# Patient Record
Sex: Female | Born: 1955 | Race: Black or African American | Hispanic: No | Smoking: Never smoker
Health system: Southern US, Community
[De-identification: ages and names within clinical notes are randomized; demographics above are authoritative.]

## PROBLEM LIST (undated history)

## (undated) DIAGNOSIS — I10 Essential (primary) hypertension: Secondary | ICD-10-CM

## (undated) DIAGNOSIS — K219 Gastro-esophageal reflux disease without esophagitis: Secondary | ICD-10-CM

## (undated) DIAGNOSIS — E119 Type 2 diabetes mellitus without complications: Secondary | ICD-10-CM

## (undated) HISTORY — DX: Type 2 diabetes mellitus without complications: E11.9

## (undated) HISTORY — DX: Gastro-esophageal reflux disease without esophagitis: K21.9

## (undated) HISTORY — DX: Essential (primary) hypertension: I10

## (undated) HISTORY — PX: OTHER SURGICAL HISTORY: SHX169

---

## 2000-12-18 ENCOUNTER — Emergency Department (HOSPITAL_COMMUNITY): Admission: EM | Admit: 2000-12-18 | Discharge: 2000-12-19 | Payer: Self-pay | Admitting: Emergency Medicine

## 2001-06-17 ENCOUNTER — Other Ambulatory Visit: Admission: RE | Admit: 2001-06-17 | Discharge: 2001-06-17 | Payer: Self-pay | Admitting: Endocrinology

## 2001-07-29 ENCOUNTER — Emergency Department (HOSPITAL_COMMUNITY): Admission: EM | Admit: 2001-07-29 | Discharge: 2001-07-29 | Payer: Self-pay | Admitting: Emergency Medicine

## 2001-11-01 ENCOUNTER — Encounter: Admission: RE | Admit: 2001-11-01 | Discharge: 2001-11-01 | Payer: Self-pay | Admitting: Family Medicine

## 2001-11-01 ENCOUNTER — Encounter: Payer: Self-pay | Admitting: Family Medicine

## 2002-01-03 ENCOUNTER — Emergency Department (HOSPITAL_COMMUNITY): Admission: EM | Admit: 2002-01-03 | Discharge: 2002-01-04 | Payer: Self-pay | Admitting: Emergency Medicine

## 2002-02-04 ENCOUNTER — Encounter: Admission: RE | Admit: 2002-02-04 | Discharge: 2002-02-06 | Payer: Self-pay | Admitting: Family Medicine

## 2003-04-17 ENCOUNTER — Other Ambulatory Visit: Admission: RE | Admit: 2003-04-17 | Discharge: 2003-04-17 | Payer: Self-pay | Admitting: Family Medicine

## 2004-06-25 ENCOUNTER — Emergency Department (HOSPITAL_COMMUNITY): Admission: EM | Admit: 2004-06-25 | Discharge: 2004-06-25 | Payer: Self-pay | Admitting: Emergency Medicine

## 2005-12-17 ENCOUNTER — Emergency Department (HOSPITAL_COMMUNITY): Admission: EM | Admit: 2005-12-17 | Discharge: 2005-12-17 | Payer: Self-pay | Admitting: Emergency Medicine

## 2005-12-22 ENCOUNTER — Encounter: Admission: RE | Admit: 2005-12-22 | Discharge: 2005-12-22 | Payer: Self-pay | Admitting: Occupational Medicine

## 2006-10-06 ENCOUNTER — Emergency Department (HOSPITAL_COMMUNITY): Admission: EM | Admit: 2006-10-06 | Discharge: 2006-10-06 | Payer: Self-pay | Admitting: Family Medicine

## 2007-09-05 ENCOUNTER — Encounter: Admission: RE | Admit: 2007-09-05 | Discharge: 2007-09-05 | Payer: Self-pay | Admitting: Occupational Medicine

## 2007-11-26 DIAGNOSIS — K644 Residual hemorrhoidal skin tags: Secondary | ICD-10-CM | POA: Insufficient documentation

## 2008-02-15 ENCOUNTER — Emergency Department (HOSPITAL_COMMUNITY): Admission: EM | Admit: 2008-02-15 | Discharge: 2008-02-15 | Payer: Self-pay | Admitting: Emergency Medicine

## 2008-12-23 DIAGNOSIS — J4520 Mild intermittent asthma, uncomplicated: Secondary | ICD-10-CM | POA: Insufficient documentation

## 2009-01-14 ENCOUNTER — Encounter: Admission: RE | Admit: 2009-01-14 | Discharge: 2009-01-14 | Payer: Self-pay | Admitting: Family Medicine

## 2009-07-20 DIAGNOSIS — I10 Essential (primary) hypertension: Secondary | ICD-10-CM | POA: Insufficient documentation

## 2010-01-24 ENCOUNTER — Emergency Department (HOSPITAL_COMMUNITY): Admission: EM | Admit: 2010-01-24 | Discharge: 2010-01-24 | Payer: Self-pay | Admitting: Emergency Medicine

## 2010-01-27 DIAGNOSIS — D219 Benign neoplasm of connective and other soft tissue, unspecified: Secondary | ICD-10-CM | POA: Insufficient documentation

## 2010-02-18 ENCOUNTER — Ambulatory Visit (HOSPITAL_COMMUNITY): Admission: RE | Admit: 2010-02-18 | Discharge: 2010-02-18 | Payer: Self-pay | Admitting: Occupational Medicine

## 2011-05-25 LAB — POCT URINALYSIS DIP (DEVICE)
Bilirubin Urine: NEGATIVE
Hgb urine dipstick: NEGATIVE
Nitrite: NEGATIVE
pH: 6.5

## 2011-12-29 DIAGNOSIS — E119 Type 2 diabetes mellitus without complications: Secondary | ICD-10-CM | POA: Insufficient documentation

## 2012-04-09 ENCOUNTER — Encounter: Payer: 59 | Attending: Sports Medicine | Admitting: *Deleted

## 2012-04-10 ENCOUNTER — Encounter: Payer: Self-pay | Admitting: *Deleted

## 2012-04-10 NOTE — Progress Notes (Signed)
  Patient was seen on 04/09/2012 for the first of a series of three diabetes self-management courses at the [facility].  Current A1c as of 04/02/12 = 6.8%  The following learning objectives were met by the patient during this course:   Defines the role of glucose and insulin  Identifies type of diabetes and pathophysiology  Defines the diagnostic criteria for diabetes and prediabetes  States the risk factors for Type 2 Diabetes  States the symptoms of Type 2 Diabetes  Defines Type 2 Diabetes treatment goals  Defines Type 2 Diabetes treatment options  States the rationale for glucose monitoring  Identifies A1C, glucose targets, and testing times  Identifies proper sharps disposal  Defines the purpose of a diabetes food plan  Identifies carbohydrate food groups  Defines effects of carbohydrate foods on glucose levels  Identifies carbohydrate choices/grams/food labels  States benefits of physical activity and effect on glucose  Review of suggested activity guidelines  Handouts given during class include:  Type 2 Diabetes: Basics Book  My Food Plan Book  Food and Activity Log  Follow-Up Plan: Core Class 2

## 2012-04-10 NOTE — Patient Instructions (Signed)
Goals:  Follow Diabetes Meal Plan as instructed  Eat 3 meals and 2 snacks, every 3-5 hrs  Limit carbohydrate intake to 30-45 grams carbohydrate/meal  Limit carbohydrate intake to 0-15 grams carbohydrate/snack  Add lean protein foods to meals/snacks  Monitor glucose levels as instructed by your doctor  Aim for 15-30 mins of physical activity daily as tolerated  Bring food record and glucose log to your next nutrition visit 

## 2012-05-01 ENCOUNTER — Encounter: Payer: 59 | Attending: "Endocrinology | Admitting: Dietician

## 2013-02-14 ENCOUNTER — Encounter: Payer: Self-pay | Admitting: Sports Medicine

## 2013-02-14 ENCOUNTER — Ambulatory Visit (INDEPENDENT_AMBULATORY_CARE_PROVIDER_SITE_OTHER): Payer: 59 | Admitting: Sports Medicine

## 2013-02-14 VITALS — BP 127/73 | HR 79 | Wt 206.0 lb

## 2013-02-14 DIAGNOSIS — M722 Plantar fascial fibromatosis: Secondary | ICD-10-CM

## 2013-02-14 MED ORDER — MELOXICAM 15 MG PO TABS: ORAL_TABLET | ORAL | Status: DC

## 2013-02-14 NOTE — Assessment & Plan Note (Signed)
Custom orthotics, she will return for this. Mobic. Aggressive home exercises. Return in 3 weeks and if no better we will inject.

## 2013-02-14 NOTE — Progress Notes (Signed)
  Subjective:    CC: Bilateral foot pain  HPI: This is a very pleasant 57 year old female comes in with a several month history of pain that she localizes at the plantar aspect of the calcaneus, worse with the first few steps in the morning, better through the day, but worse at the end of the day. Pain is localized, severe, does not radiate, persistent. She has not tried any NSAIDs, stretches, did try some over-the-counter inserts. These were moderately beneficial.  Past medical history, Surgical history, Family history not pertinant except as noted below, Social history, Allergies, and medications have been entered into the medical record, reviewed, and no changes needed.   Review of Systems: No headache, visual changes, nausea, vomiting, diarrhea, constipation, dizziness, abdominal pain, skin rash, fevers, chills, night sweats, swollen lymph nodes, weight loss, chest pain, body aches, joint swelling, muscle aches, shortness of breath, mood changes, visual or auditory hallucinations.  Objective:    General: Well Developed, well nourished, and in no acute distress.  Neuro: Alert and oriented x3, extra-ocular muscles intact, sensation grossly intact.  HEENT: Normocephalic, atraumatic, pupils equal round reactive to light, neck supple, no masses, no lymphadenopathy, thyroid nonpalpable.  Skin: Warm and dry, no rashes noted.  Cardiac: Regular rate and rhythm, no murmurs rubs or gallops.  Respiratory: Clear to auscultation bilaterally. Not using accessory muscles, speaking in full sentences.  Abdominal: Soft, nontender, nondistended, positive bowel sounds, no masses, no organomegaly.  Bilateral Foot: No visible erythema or swelling. Range of motion is full in all directions. Strength is 5/5 in all directions. No hallux valgus. No pes cavus or pes planus. No abnormal callus noted. No pain over the navicular prominence, or base of fifth metatarsal. Mild tenderness to palpation of the calcaneal  insertion of plantar fascia on both sides. No pain at the Achilles insertion. No pain over the calcaneal bursa. No pain of the retrocalcaneal bursa. No tenderness to palpation over the tarsals, metatarsals, or phalanges. No hallux rigidus or limitus. No tenderness palpation over interphalangeal joints. No pain with compression of the metatarsal heads. Neurovascularly intact distally. Impression and Recommendations:    The patient was counselled, risk factors were discussed, anticipatory guidance given.

## 2013-02-17 ENCOUNTER — Ambulatory Visit (INDEPENDENT_AMBULATORY_CARE_PROVIDER_SITE_OTHER): Payer: 59 | Admitting: Sports Medicine

## 2013-02-17 ENCOUNTER — Encounter: Payer: Self-pay | Admitting: Sports Medicine

## 2013-02-17 VITALS — BP 143/84 | HR 77

## 2013-02-17 DIAGNOSIS — M722 Plantar fascial fibromatosis: Secondary | ICD-10-CM

## 2013-02-17 NOTE — Progress Notes (Signed)
    Patient was fitted for a : standard, cushioned, semi-rigid orthotic. The orthotic was heated and afterward the patient stood on the orthotic blank positioned on the orthotic stand. The patient was positioned in subtalar neutral position and 10 degrees of ankle dorsiflexion in a weight bearing stance. After completion of molding, a stable base was applied to the orthotic blank. The blank was ground to a stable position for weight bearing. Size: 8 Base: Blue EVA Additional Posting and Padding: None The patient ambulated these, and they were very comfortable.  I spent 40 minutes with this patient, greater than 50% was face-to-face time counseling regarding the below diagnosis.   

## 2013-02-17 NOTE — Assessment & Plan Note (Signed)
70% improved with conservative measures, custom orthotics as above. Month. I think she's also going to comeback a little sooner for another set of orthotics.

## 2013-02-24 ENCOUNTER — Encounter: Payer: Self-pay | Admitting: Sports Medicine

## 2013-02-24 ENCOUNTER — Ambulatory Visit (INDEPENDENT_AMBULATORY_CARE_PROVIDER_SITE_OTHER): Payer: 59 | Admitting: Sports Medicine

## 2013-02-24 VITALS — BP 158/102 | HR 78 | Wt 207.0 lb

## 2013-02-24 DIAGNOSIS — M722 Plantar fascial fibromatosis: Secondary | ICD-10-CM

## 2013-02-24 NOTE — Progress Notes (Signed)
    Patient was fitted for a : standard, cushioned, semi-rigid orthotic. The orthotic was heated and afterward the patient stood on the orthotic blank positioned on the orthotic stand. The patient was positioned in subtalar neutral position and 10 degrees of ankle dorsiflexion in a weight bearing stance. After completion of molding, a stable base was applied to the orthotic blank. The blank was ground to a stable position for weight bearing. Size: 8 Base: Blue EVA Additional Posting and Padding: None The patient ambulated these, and they were very comfortable.  I spent 40 minutes with this patient, greater than 50% was face-to-face time counseling regarding the below diagnosis.   

## 2013-02-24 NOTE — Assessment & Plan Note (Signed)
Second set of custom orthotics as above. Return in one month, we can consider a plantar fascia injection if no better at that point.

## 2013-03-04 DIAGNOSIS — E66811 Obesity, class 1: Secondary | ICD-10-CM | POA: Insufficient documentation

## 2013-03-04 DIAGNOSIS — E669 Obesity, unspecified: Secondary | ICD-10-CM | POA: Insufficient documentation

## 2013-03-24 ENCOUNTER — Ambulatory Visit: Payer: 59 | Admitting: Sports Medicine

## 2013-03-31 ENCOUNTER — Ambulatory Visit: Payer: 59 | Admitting: *Deleted

## 2013-04-04 ENCOUNTER — Ambulatory Visit (INDEPENDENT_AMBULATORY_CARE_PROVIDER_SITE_OTHER): Payer: 59 | Admitting: Sports Medicine

## 2013-04-04 ENCOUNTER — Encounter: Payer: Self-pay | Admitting: Sports Medicine

## 2013-04-04 VITALS — BP 127/83 | HR 77 | Wt 208.0 lb

## 2013-04-04 DIAGNOSIS — M722 Plantar fascial fibromatosis: Secondary | ICD-10-CM

## 2013-04-04 NOTE — Assessment & Plan Note (Signed)
Failed conservative measures including 2 months of orthotics, stretches, NSAIDs. Bilateral injection today. Return in one month, continue exercises, continue orthotics.

## 2013-04-04 NOTE — Progress Notes (Signed)
  Subjective:    CC: Followup  HPI: Bilateral plantar fasciitis: This pleasant patient has been doing conservative measures, and wearing her custom orthotics for 2 months now. Unfortunately she still has pain she localizes at the calcaneal insertion of the plantar fascia, worse with the first few steps in the morning, her orthotics are comfortable, but she still has pain and does desire interventional treatment. Pain is moderate, persistent.  Past medical history, Surgical history, Family history not pertinant except as noted below, Social history, Allergies, and medications have been entered into the medical record, reviewed, and no changes needed.   Review of Systems: No fevers, chills, night sweats, weight loss, chest pain, or shortness of breath.   Objective:    General: Well Developed, well nourished, and in no acute distress.  Neuro: Alert and oriented x3, extra-ocular muscles intact, sensation grossly intact.  HEENT: Normocephalic, atraumatic, pupils equal round reactive to light, neck supple, no masses, no lymphadenopathy, thyroid nonpalpable.  Skin: Warm and dry, no rashes. Cardiac: Regular rate and rhythm, no murmurs rubs or gallops, no lower extremity edema.  Respiratory: Clear to auscultation bilaterally. Not using accessory muscles, speaking in full sentences.  Procedure: Real-time Ultrasound Guided Injection of left plantar fascia Device: GE Logiq E  Verbal informed consent obtained.  Time-out conducted.  Noted no overlying erythema, induration, or other signs of local infection.  Skin prepped in a sterile fashion.  Local anesthesia: Topical Ethyl chloride.  With sterile technique and under real time ultrasound guidance:  1 cc Kenalog 40, 3 cc lidocaine injected just deep to the calcaneal insertion of the plantar fascia, the plantar fascia did appear quite thickened on ultrasound. Completed without difficulty  Pain immediately resolved suggesting accurate placement of the  medication.  Advised to call if fevers/chills, erythema, induration, drainage, or persistent bleeding.  Images permanently stored and available for review in the ultrasound unit.  Impression: Technically successful ultrasound guided injection.  Procedure: Real-time Ultrasound Guided Injection of right plantar fascia Device: GE Logiq E  Verbal informed consent obtained.  Time-out conducted.  Noted no overlying erythema, induration, or other signs of local infection.  Skin prepped in a sterile fashion.  Local anesthesia: Topical Ethyl chloride.  With sterile technique and under real time ultrasound guidance:  1 cc Kenalog 40, 3 cc lidocaine injected just deep to the calcaneal insertion of the plantar fascia, the plantar fascia did appear quite thickened on ultrasound. Completed without difficulty  Pain immediately resolved suggesting accurate placement of the medication.  Advised to call if fevers/chills, erythema, induration, drainage, or persistent bleeding.  Images permanently stored and available for review in the ultrasound unit.  Impression: Technically successful ultrasound guided injection. Impression and Recommendations:

## 2013-05-01 ENCOUNTER — Ambulatory Visit (INDEPENDENT_AMBULATORY_CARE_PROVIDER_SITE_OTHER): Payer: 59 | Admitting: Sports Medicine

## 2013-05-01 ENCOUNTER — Encounter: Payer: Self-pay | Admitting: Sports Medicine

## 2013-05-01 VITALS — BP 141/87 | HR 83 | Wt 206.0 lb

## 2013-05-01 DIAGNOSIS — M722 Plantar fascial fibromatosis: Secondary | ICD-10-CM

## 2013-05-01 MED ORDER — NAPROXEN 500 MG PO TABS: 500.0000 mg | ORAL_TABLET | Freq: Two times a day (BID) | ORAL | Status: DC

## 2013-05-01 NOTE — Assessment & Plan Note (Addendum)
Left foot is pain-free after injection and custom orthotics. Repeat injection into the right plantar fascia. Continue exercises, orthotics, she would like to return for a repeat set of custom orthotics.

## 2013-05-01 NOTE — Progress Notes (Signed)
  Subjective:    CC: Follow up  HPI: Left foot is now pain-free after ultrasound guided injection and custom orthotics, unfortunately she continues to have pain that is less so at present at the calcaneal insertion of the right plantar fascia. Symptoms are mild, persistent. She does desire repeat interventional treatment. She has been doing her exercises as prescribed. No radiation to pain.  Past medical history, Surgical history, Family history not pertinant except as noted below, Social history, Allergies, and medications have been entered into the medical record, reviewed, and no changes needed.   Review of Systems: No fevers, chills, night sweats, weight loss, chest pain, or shortness of breath.   Objective:    General: Well Developed, well nourished, and in no acute distress.  Neuro: Alert and oriented x3, extra-ocular muscles intact, sensation grossly intact.  HEENT: Normocephalic, atraumatic, pupils equal round reactive to light, neck supple, no masses, no lymphadenopathy, thyroid nonpalpable.  Skin: Warm and dry, no rashes. Cardiac: Regular rate and rhythm, no murmurs rubs or gallops, no lower extremity edema.  Respiratory: Clear to auscultation bilaterally. Not using accessory muscles, speaking in full sentences.  Procedure: Real-time Ultrasound Guided Injection of right plantar fascia Device: GE Logiq E  Verbal informed consent obtained.  Time-out conducted.  Noted no overlying erythema, induration, or other signs of local infection.  Skin prepped in a sterile fashion.  Local anesthesia: Topical Ethyl chloride.  With sterile technique and under real time ultrasound guidance:  1 cc Kenalog 40, 2 cc lidocaine injected just deep to the calcaneal insertion of the plantar fascia. Completed without difficulty  Pain immediately resolved suggesting accurate placement of the medication.  Advised to call if fevers/chills, erythema, induration, drainage, or persistent bleeding.  Images  permanently stored and available for review in the ultrasound unit.  Impression: Technically successful ultrasound guided injection.  Impression and Recommendations:

## 2013-05-08 ENCOUNTER — Encounter: Payer: 59 | Admitting: Sports Medicine

## 2013-05-13 ENCOUNTER — Encounter: Payer: 59 | Admitting: Sports Medicine

## 2013-05-13 DIAGNOSIS — Z0289 Encounter for other administrative examinations: Secondary | ICD-10-CM

## 2014-05-20 DIAGNOSIS — K5730 Diverticulosis of large intestine without perforation or abscess without bleeding: Secondary | ICD-10-CM | POA: Insufficient documentation

## 2014-06-15 LAB — HM MAMMOGRAPHY

## 2015-02-22 LAB — HM MAMMOGRAPHY

## 2015-05-06 LAB — HM PAP SMEAR
HM PAP: ABNORMAL
HM Pap smear: NEGATIVE

## 2015-06-04 LAB — HM COLONOSCOPY

## 2015-09-01 MED FILL — AMLODIPINE BESYLATE 5 MG TA: 5 | 90 days supply | Qty: 90 | Fill #1

## 2015-09-01 MED FILL — LOSARTAN-HCTZ 100-25 MG TAB: 100-25 | 90 days supply | Qty: 90 | Fill #1

## 2015-09-01 MED FILL — OMEPRAZOLE DR 40 MG CAPSULE: 40 | 90 days supply | Qty: 90 | Fill #0

## 2015-11-15 DIAGNOSIS — M25562 Pain in left knee: Secondary | ICD-10-CM | POA: Diagnosis not present

## 2015-11-15 DIAGNOSIS — E119 Type 2 diabetes mellitus without complications: Secondary | ICD-10-CM | POA: Diagnosis not present

## 2015-11-15 DIAGNOSIS — G8929 Other chronic pain: Secondary | ICD-10-CM | POA: Diagnosis not present

## 2015-11-15 DIAGNOSIS — I10 Essential (primary) hypertension: Secondary | ICD-10-CM | POA: Diagnosis not present

## 2015-11-15 DIAGNOSIS — M25561 Pain in right knee: Secondary | ICD-10-CM | POA: Diagnosis not present

## 2015-11-15 MED FILL — NAPROXEN 500 MG TABLET: 500 | 30 days supply | Qty: 60 | Fill #0

## 2015-11-17 MED FILL — metFORMIN HCL 500 MG TABS: 500 | 30 days supply | Qty: 60 | Fill #0

## 2015-12-02 ENCOUNTER — Encounter: Payer: 59 | Attending: Internal Medicine | Admitting: *Deleted

## 2015-12-02 VITALS — Ht 64.0 in | Wt 203.8 lb

## 2015-12-02 DIAGNOSIS — Z713 Dietary counseling and surveillance: Secondary | ICD-10-CM | POA: Diagnosis not present

## 2015-12-02 DIAGNOSIS — E119 Type 2 diabetes mellitus without complications: Secondary | ICD-10-CM

## 2015-12-02 MED FILL — AMLODIPINE BESYLATE 5 MG TA: 5 | 90 days supply | Qty: 90 | Fill #0

## 2015-12-02 MED FILL — LOSARTAN-HCTZ 100-25 MG TAB: 100-25 | 30 days supply | Qty: 30 | Fill #0

## 2015-12-02 MED FILL — OMEPRAZOLE DR 40 MG CAPSULE: 40 | 90 days supply | Qty: 90 | Fill #1

## 2015-12-02 NOTE — Patient Instructions (Signed)
Plan:  Aim for 2-3 Carb Choices per meal (30-45 grams) +/- 1 either way  Aim for 0-15 Carbs per snack if hungry  Include protein in moderation with your meals and snacks Consider reading food labels for Total Carbohydrate and Fat Grams of foods Consider  increasing your activity level by exercise for 30 minutes daily as tolerated Consider checking BG at alternate times to include fasting and 2 hours after a real meal  Continue taking medication as directed by MD  Connect with Link to [facility]. I have called them and left a message with Mickel Baas to call you back. If you do not hear from her please call them back at the number I gave you.

## 2015-12-07 ENCOUNTER — Encounter: Payer: Self-pay | Admitting: *Deleted

## 2015-12-07 NOTE — Progress Notes (Signed)
Diabetes Self-Management Education  Visit Type: First/Initial  Appt. Start Time: 1100 Appt. End Time: 1230  12/07/2015  Ms. Leah Nash, identified by name and date of birth, is a 60 y.o. female with a diagnosis of Diabetes: Type 2. Leah Nash notes that she has been in denial in relationship to her T2DM. She is an employee of [employer] and has referred herself for education. She is going to school for Social Work. Her grown daughters live with her. She is anxious for them to move out on their own. She lost her mother recently.  ASSESSMENT  Height 5\' 4"  (1.626 m), weight 203 lb 12.8 oz (92.443 kg). Body mass index is 34.96 kg/(m^2).      Diabetes Self-Management Education - 12/02/15 1122    Visit Information   Visit Type First/Initial   Initial Visit   Diabetes Type Type 2   Are you currently following a meal plan? No   Are you taking your medications as prescribed? Yes   Psychosocial Assessment   Patient Belief/Attitude about Diabetes Motivated to manage diabetes   Self-care barriers None   Self-management support Doctor's office;CDE visits;Family   Other persons present Patient   Patient Concerns Nutrition/Meal planning;Medication;Monitoring;Healthy Lifestyle;Glycemic Control;Weight Control   Special Needs None   Preferred Learning Style No preference indicated   Learning Readiness Change in progress   Complications   Last HgB A1C per patient/outside source 7.3 %   How often do you check your blood sugar? 1-2 times/day   Fasting Blood glucose range (mg/dL) 70-129   Postprandial Blood glucose range (mg/dL) 70-129   Dietary Intake   Breakfast plant based powder drink mixed with water or unsweetened almond milk / smoothing, Kale, spinach, strawberries, blueberries   Snack (morning) crackers & cheese    Lunch salade ( greens, [place], vegetables, feta cheese low fat dressing) Avocado   Snack (afternoon) skim milk   Dinner black rice, purple cabbage, pork ribs   Snack  (evening) crackers, chicken & cheese   Beverage(s) water,  skim milk, almond milk   Exercise   Exercise Type Moderate (swimming / aerobic walking);Light (walking / raking leaves)  water aerobics, walking campus of [employer] where she goes tos chool.   How many days per week to you exercise? 7   How many minutes per day do you exercise? 30   Total minutes per week of exercise 210   Patient Education   Previous Diabetes Education No   Disease state  Definition of diabetes, type 1 and 2, and the diagnosis of diabetes;Factors that contribute to the development of diabetes   Nutrition management  Role of diet in the treatment of diabetes and the relationship between the three main macronutrients and blood glucose level;Food label reading, portion sizes and measuring food.;Carbohydrate counting;Information on hints to eating out and maintain blood glucose control.;Meal options for control of blood glucose level and chronic complications.   Physical activity and exercise  Role of exercise on diabetes management, blood pressure control and cardiac health.   Monitoring Taught/discussed recording of test results and interpretation of SMBG.   Chronic complications Relationship between chronic complications and blood glucose control   Personal strategies to promote health Lifestyle issues that need to be addressed for better diabetes care   Individualized Goals (developed by patient)   Nutrition General guidelines for healthy choices and portions discussed   Physical Activity Exercise 5-7 days per week;30 minutes per day   Medications take my medication as prescribed   Monitoring  test  my blood glucose as discussed   Reducing Risk increase portions of nuts and seeds   Outcomes   Expected Outcomes Demonstrated interest in learning. Expect positive outcomes   Future DMSE PRN   Program Status Completed      Individualized Plan for Diabetes Self-Management Training:   Learning Objective:  Patient will have  a greater understanding of diabetes self-management. Patient education plan is to attend individual and/or group sessions per assessed needs and concerns.   Plan:   Patient Instructions  Plan:  Aim for 2-3 Carb Choices per meal (30-45 grams) +/- 1 either way  Aim for 0-15 Carbs per snack if hungry  Include protein in moderation with your meals and snacks Consider reading food labels for Total Carbohydrate and Fat Grams of foods Consider  increasing your activity level by exercise for 30 minutes daily as tolerated Consider checking BG at alternate times to include fasting and 2 hours after a real meal  Continue taking medication as directed by MD  Connect with Link to [employer]. I have called them and left a message with Mickel Baas to call you back. If you do not hear from her please call them back at the number I gave you.   Expected Outcomes:  Demonstrated interest in learning. Expect positive outcomes  Education material provided: Living Well with Diabetes, A1C conversion sheet, Meal plan card, My Plate and Snack sheet  If problems or questions, patient to contact team via:  Phone  Future DSME appointment: PRN

## 2015-12-21 MED FILL — KLOR-CON M10 TABLET: 10 | 30 days supply | Qty: 30 | Fill #0

## 2015-12-21 MED FILL — metFORMIN HCL 500 MG TABS: 500 | 30 days supply | Qty: 60 | Fill #1

## 2016-01-14 MED FILL — LOSARTAN-HCTZ 100-25 MG TAB: 100-25 | 30 days supply | Qty: 30 | Fill #1

## 2016-01-20 ENCOUNTER — Encounter: Payer: Self-pay | Admitting: *Deleted

## 2016-01-20 ENCOUNTER — Other Ambulatory Visit: Payer: Self-pay | Admitting: *Deleted

## 2016-01-20 NOTE — Patient Outreach (Addendum)
[facility]) Care Management   01/20/2016  Leah Nash 1955/10/19 [id number]  Leah Nash is an 60 y.o. female who presents to the [facility] office to enroll in the Link To Wellness program for self management assistance with Type II DM, HTN and obesity.  Subjective:  "[name]" says she is here to enroll in the Link To Wellness for self management assistance to improve her glycemic control and to lose weight.   Objective:   Review of Systems  Constitutional: Negative.     Physical Exam  Constitutional: She is oriented to person, place, and time. She appears well-developed and well-nourished.  Respiratory: Effort normal.  Neurological: She is alert and oriented to person, place, and time.  Skin: Skin is warm and dry.  Psychiatric: She has a normal mood and affect. Her behavior is normal. Judgment and thought content normal.   Filed Vitals:   01/20/16 1518  BP: 122/88   Filed Weights   01/20/16 1518  Weight: 197 lb 6.4 oz (89.54 kg)    Encounter Medications:   Outpatient Encounter Prescriptions as of 01/20/2016  Medication Sig Note  . amLODipine (NORVASC) 5 MG tablet Take 5 mg by mouth daily.   . fish oil-omega-3 fatty acids 1000 MG capsule Take 2 g by mouth daily.   Marland Kitchen omeprazole (PRILOSEC) 40 MG capsule Take 40 mg by mouth daily. 01/20/2016: Takes prn  . triamterene-hydrochlorothiazide (DYAZIDE) 37.5-25 MG per capsule Take 1 capsule by mouth every morning.   . [DISCONTINUED] potassium chloride (K-DUR,KLOR-CON) 10 MEQ tablet Take 10 mEq by mouth 2 (two) times daily.   . naproxen (NAPROSYN) 500 MG tablet Take 1 tablet (500 mg total) by mouth 2 (two) times daily with a meal. (Patient not taking: Reported on 01/20/2016)    Metformin  500 mg twice daily   . Potassium 75 MG TABS Take by mouth. 01/20/2016: Received from: [facility]   No facility-administered encounter medications on file as of 01/20/2016.     Functional Status:   In your present state of health, do you have any difficulty performing the following activities: 01/20/2016  Hearing? N  Vision? N  Difficulty concentrating or making decisions? N  Walking or climbing stairs? N  Dressing or bathing? N  Doing errands, shopping? N  Preparing Food and eating ? N  Using the Toilet? N  In the past six months, have you accidently leaked urine? N  Do you have problems with loss of bowel control? N  Managing your Medications? N  Managing your Finances? N  Housekeeping or managing your Housekeeping? N    Fall/Depression Screening:    PHQ 2/9 Scores 01/20/2016 12/07/2015  PHQ - 2 Score 3 0  PHQ- 9 Score 6 -    Assessment:   [employer] employee with Type II DM, HTN and obesity enrolling in the Link To Wellness program for self management assistance of chronic disease states.  Plan:  [facility] CM Care Plan Problem One        Most Recent Value   Care Plan Problem One  Type II DM, HTN and obesity enrolling in the Link To Wellness program for self management of chronic disease states   Role Documenting the Problem One  Care Management Coordinator   Care Plan for Problem One  Active   THN Long Term Goal (31-90 days)  Improved glycemic control as evidenced by improved Hgb A1C of <7.3%, ongoing good control of HTN and weight loss or  no weight gain at next assessment   THN Long Term Goal Start Date  01/20/16   Interventions for Problem One Long Term Goal  Discussed Link to Wellness program goals, requirements and benefits, reviewed member's rights and responsibilities, provided diabetes information packet with explanation of contents, ensured member agreed and signed consent to participate and authorization to release and receive health information, consent, participation agreement and consent to enroll in program, assessed member's current knowledge of Type II diabetes, using the [employer] representation, discussed the 8 core  pathophysiologic deficits in Type II diabetes. Discussed physiology of diabetes as a chronic progressive disease with the initial problem of insulin resistance in the muscle, liver and fat cells and then increased loss of beta cell function over time resulting in decreased insulin production, discussed role of obesity, especially abdominal (visceral) obesity, on insulin resistance, discussed how losing 7% of BMI can improve glycemic control, reviewed patient's medications and assessed medication adherence, discussed DM medications of Metformin including the mechanism of action, common side effects, dosages and dosing schedule, discussed recommendation by ADA that  a B12 level be checked intermittently since Metformin can cause B12 deficiency,  reinforced importance of taking all medications as prescribed, reviewed education on the three primary macronutrients (CHO, protein, fat) and their effect on glucose levels,  provided education on carb counting, importance of regularly scheduled meals/snacks, and meal planning, reviewed approximate amount of CHOs to aim for at meals ( 30-45 gm ) and snacks (15 gms), discussed recommended daily amounts of fiber (30 gm)  and protein- 46 for adult women and how these two macronutrients help with satiety, reviewed portion size and macronutrients, discussed phone applications to use to help with CHO counting when food labels are not available, provided list of phone applications available and approximate cost (if applicable) to download, discussed nutritional counseling benefit provided by [facility] health plan and encouraged patient to see dietician to assist with dietary management of diabetes, discussed effects of physical activity on glucose levels and long-term glucose control by improving insulin sensitivity and assisting with weight management and cardiovascular health, encouraged patient to continue to exercise,  reviewed American Diabetes Association recommendations  of 150 minutes of exercise per week including two sessions of resistance exercise weekly, discussed exercise opportunities offered for free by [facility], issued a True Metrix glucometer and starter kit and instructed her on it's use with correct teachback,  reviewed patient's CBG readings, discussed blood glucose monitoring and interpretation, discussed recommended target ranges for pre-meal and post-meal, provided blood sugar log sheets with targets for pre and post meal, reviewed definitions of hyperglycemia and hypoglycemia, assessed patient's hypoglycemia threshold and usual symptoms, discussed causes, other symptoms, and treatment options (Rule of 15s to treat hypoglycemia), discussed the role of stress on blood sugar and discussed results of most recent Hgb A1C, Hgb A1C goal, and correlation to estimated average glucose, assessed POC CBG and discussed results, provided information on: prevention, detection, and treatment of long-term complications, discussed the role of prolonged elevated glucose levels on body systems, discussed recommendations for day to day and long-term diabetes self-care, reviewed recommended daily foot checks, and yearly cholesterol, urine, and eye testing, and recommendations for medical, dental, and emotional self-care, discussed HTN treatment goals and issued voucher for home BP monitor for $5 at a [facility] pharmacy, discussed [place] benefit of no copay if primary care provided by a [facility] provider and provided list of [facility] primary care providers in [place], arranged for Link To  Wellness follow up on 04/18/16     RNCM to fax today's office visit note to patient's primary care provider and will request Rx for DM testing supplies be faxed to the [facility]. RNCM will meet quarterly and as needed with patient per Link To Wellness program guidelines to assist with Type II DM, HTN and obesity self-management and assess patient's progress toward mutually set  goals.  Barrington Ellison RN,CCM,CDE [place] Management Coordinator Link To Wellness Office Phone [phone] Office Fax [phone]

## 2016-01-21 MED FILL — metFORMIN HCL 500 MG TABS: 500 | 30 days supply | Qty: 60 | Fill #2

## 2016-01-25 ENCOUNTER — Encounter: Payer: Self-pay | Admitting: *Deleted

## 2016-01-27 ENCOUNTER — Other Ambulatory Visit: Payer: Self-pay | Admitting: *Deleted

## 2016-01-27 MED FILL — TRUE METRIX GLUCOSE TEST ST: 50 days supply | Qty: 100 | Fill #0

## 2016-01-27 MED FILL — TRUEplus LANCETS 30G MISC: 50 days supply | Qty: 100 | Fill #0

## 2016-01-27 NOTE — Patient Outreach (Signed)
Received call from [facility] that [name] had called there requesting her DM testing supplies. This RNCM called her primary care provider and left a message with the receptionist requesting that a prescription for DM supplies be faxed to the OP pharmacy as was previously requested in the letter faxed to the office on 5/30. Also called [name] and told her the same and advised her to continue to use her Mom's glucometer and testing supplies until hers are ready to be picked up. [name] voiced understanding and compliance to request. Barrington Ellison RN,CCM,CDE [place] Management Coordinator Link To Wellness Office Phone [phone] Office Fax [phone]

## 2016-02-11 MED FILL — LOSARTAN-HCTZ 100-25 MG TAB: 100-25 | 90 days supply | Qty: 90 | Fill #2

## 2016-02-11 MED FILL — metFORMIN HCL 500 MG TABS: 500 | 30 days supply | Qty: 60 | Fill #0

## 2016-02-11 MED FILL — POTASSIUM CL ER 10 MEQ TAB: 10 | 30 days supply | Qty: 30 | Fill #1

## 2016-02-16 MED FILL — VENTOLIN HFA 90 MCG INHALER: 108 (90 BAS | 25 days supply | Qty: 18 | Fill #0

## 2016-02-22 ENCOUNTER — Ambulatory Visit: Payer: 59 | Admitting: Osteopathic Medicine

## 2016-02-28 ENCOUNTER — Ambulatory Visit: Payer: 59 | Admitting: Osteopathic Medicine

## 2016-03-10 ENCOUNTER — Ambulatory Visit (INDEPENDENT_AMBULATORY_CARE_PROVIDER_SITE_OTHER): Payer: 59 | Admitting: Osteopathic Medicine

## 2016-03-10 ENCOUNTER — Encounter: Payer: Self-pay | Admitting: Osteopathic Medicine

## 2016-03-10 VITALS — BP 129/83 | HR 73 | Ht 65.0 in | Wt 193.0 lb

## 2016-03-10 DIAGNOSIS — J069 Acute upper respiratory infection, unspecified: Secondary | ICD-10-CM

## 2016-03-10 DIAGNOSIS — B9789 Other viral agents as the cause of diseases classified elsewhere: Secondary | ICD-10-CM

## 2016-03-10 DIAGNOSIS — I10 Essential (primary) hypertension: Secondary | ICD-10-CM | POA: Diagnosis not present

## 2016-03-10 DIAGNOSIS — E119 Type 2 diabetes mellitus without complications: Secondary | ICD-10-CM | POA: Diagnosis not present

## 2016-03-10 DIAGNOSIS — R87610 Atypical squamous cells of undetermined significance on cytologic smear of cervix (ASC-US): Secondary | ICD-10-CM

## 2016-03-10 DIAGNOSIS — R21 Rash and other nonspecific skin eruption: Secondary | ICD-10-CM

## 2016-03-10 LAB — POCT GLYCOSYLATED HEMOGLOBIN (HGB A1C): Hemoglobin A1C: 6.4

## 2016-03-10 MED ORDER — BENZONATATE 200 MG PO CAPS: 200.0000 mg | ORAL_CAPSULE | Freq: Three times a day (TID) | ORAL | Status: DC | PRN

## 2016-03-10 MED FILL — TRUE METRIX GLUCOSE TEST ST: 50 days supply | Qty: 100 | Fill #1

## 2016-03-10 MED FILL — TRUEplus LANCETS 30G MISC: 50 days supply | Qty: 100 | Fill #1

## 2016-03-10 MED FILL — BENZONATATE 200 MG CAPSULE: 200 | 10 days supply | Qty: 30 | Fill #0

## 2016-03-10 NOTE — Patient Instructions (Addendum)
Can discontinue the Amlodipine for blood pressure since your pressure looks good today off medications - I think the weight loss has helped with the blood pressure!   If you're still coughing in the next 1 - 2 weeks, please come back to the clinic - we may need to get a chest xray. Continue Flonase, can use Benadryl or Allegra as well.   For rash - try Allegra during the day and Benadryl in the evening to help with sleep. If rash gets worse, let me know.      DR. Redgie Grayer IMPORTANT  INFORMATION FOR NEW PATIENTS!  PLEASE REVIEW CAREFULLY!    FOLLOW-UP!   Let's plan to follow-up in the office in 3 months for repeat Pap and annual wellness exam.   Long term, let's plan to follow-up every 3 - 6 months for management/monitoring of chronic medical issues such as Diabetes and High Blood Pressure, as well as management of your prescription medications.   Please don't hesitate to make an appointment sooner if you're having any acute concerns or problems!  REGARDING PRESCRIPTION MEDICATIONS  Please let your pharmacy know when you are running low on medications/refills (do not wait until you are out of medicines). Your pharmacy will send our office a request for the appropriate medications. Please allow our office 2-3 business days to process the needed refills.   For controlled substances, you may be asked to schedule an office visit and/or undergo a random urine drug screen for continuation of such medications.   REGARDING ANY CARE YOU RECEIVE OUTSIDE OUR OFFICE  At any visits to any specialists, or if you receive vaccines anywhere outside our office, please provide our clinic information so that they can forward [place] any records, including any tests which are done, changes to your medications, or new vaccinations.   Also, if you are ever treated in an emergency room or if you are admitted to the hospital, please contact our office after your discharge. We encourage our patients to schedule a  follow-up visit with their PCP any time they are treated for a serious illness or injury.   This allows all your physicians to communicate effectively, putting your primary care doctor at the center of your medical care and allowing [place] to effectively coordinate your care.  REGARDING PREVENTIVE CARE & WELLNESS, AKA "ANNUAL PHYSICAL"  Let's plan to follow-up here in the office in 3 months for [place].   This visit is very important to make sure we talk with our patients about all recommended cancer screenings, vaccines, birth control (if desired) and screenings for other diseases based on your personal/family history. Plus, this visit should be completely covered under your insurance!   We also like to talk about any changes to your health over the past year and make sure any chronic conditions are well-controlled.    Please note: insurance should completely cover one preventive care visit annually, and they should completely cover most tests associated with preventive care such as routine labs, mammograms, etc. If you have any other medical concerns to address, you may be asked to reschedule your annual physical, or schedule a separate visit to address other medical concerns. Or, you may be billed for care related to "problem-based visit" in addition to your "preventive care visit." If you have questions about this, please contact our office, your insurance company, or [facility] billing department.   REGARDING ANY FORMS NEEDING YOUR DOCTOR'S SIGNATURE  If you ever have any paperwork  which needs to be completed by your PCP, you may be asked to come to the office if that paperwork requires a complex review of your medical history - we want to make sure everything is completed to your satisfaction and is completed correctly the first time, particularly with FMLA or other employment/legal matters!    Please let us know if there is anything else we can do for you.  Take care! -Dr. Loni Muse.

## 2016-03-10 NOTE — Progress Notes (Signed)
HPI: Leah Nash is a 60 y.o. Not Hispanic or Latino female  who presents to [facility] today, 03/10/2016,  for chief complaint of:  Chief Complaint  Patient presents with  . Establish Care    Diabetes, lab work.  . Cough    Type 2 diabetes: Patient is taking metformin 500 mg twice a day, A1c indicates good control. Patient is routinely checking her blood glucose at home, no concerns. Reports compliance with diabetic diet, has undergone significant weight loss over the past few years which has helped as well.  Hypertension: Medications as noted below, however patient has not taken these over the past 2 days due to travel. Blood pressure is well controlled today. No chest pain or shortness of breath, no headache or dizziness.  Cough: Patient reports dry nagging cough over the past few days. Recent travel to visit her daughter in [place], cough has been keeping her up at night. Associated symptoms include nasal congestion and runny nose.  Rash: Mild itchy rash over arms and abdomen, not sure if exposed to anything while visiting her daughter, concerned maybe food allergy, ate lots of blackberries over the course of this strip and worried maybe this.  Past medical, surgical, social and family history reviewed: Past Medical History  Diagnosis Date  . Diabetes mellitus without complication ([place])   . Hypertension   . GERD (gastroesophageal reflux disease)    Past Surgical History  Procedure Laterality Date  . None     Social History  Substance Use Topics  . Smoking status: Never Smoker   . Smokeless tobacco: Never Used  . Alcohol Use: No   Family History  Problem Relation Age of Onset  . Cancer Mother   . Alcohol abuse Father   . Depression Daughter   . Diabetes Maternal Aunt      Current medication list and allergy/intolerance information reviewed:   Current Outpatient Prescriptions  Medication Sig Dispense Refill  . amLODipine  (NORVASC) 5 MG tablet Take 5 mg by mouth daily.    . fish oil-omega-3 fatty acids 1000 MG capsule Take 2 g by mouth daily.    . metFORMIN (GLUCOPHAGE) 500 MG tablet Take 500 mg by mouth 2 (two) times daily with a meal.    . naproxen (NAPROSYN) 500 MG tablet Take 1 tablet (500 mg total) by mouth 2 (two) times daily with a meal. 60 tablet 3  . omeprazole (PRILOSEC) 40 MG capsule Take 40 mg by mouth daily.    . Potassium 75 MG TABS Take by mouth.    . triamterene-hydrochlorothiazide (DYAZIDE) 37.5-25 MG per capsule Take 1 capsule by mouth every morning.     No current facility-administered medications for this visit.   Allergies  Allergen Reactions  . Food     soy      Review of Systems:  Constitutional:  No  fever, no chills, No recent illness, No unintentional weight changes. No significant fatigue.   HEENT: No  headache, no vision change, no hearing change, No sore throat, No  sinus pressure  Cardiac: No  chest pain, No  pressure, No palpitations, No  Orthopnea  Respiratory:  No  shortness of breath. (+) Cough  Gastrointestinal: No  abdominal pain, No  nausea, No  vomiting,  No  blood in stool, No  diarrhea, No  constipation   Musculoskeletal: No new myalgia/arthralgia  Genitourinary: No  incontinence, No  abnormal genital bleeding, No abnormal genital discharge  Skin: (+) Rash, No  other wounds/concerning lesions  Hem/Onc: No  easy bruising/bleeding, No  abnormal lymph node  Endocrine: No cold intolerance,  No heat intolerance. No polyuria/polydipsia/polyphagia   Neurologic: No  weakness, No  dizziness, No  slurred speech/focal weakness/facial droop  Psychiatric: No  concerns with depression, No  concerns with anxiety, No sleep problems, No mood problems  Exam:  BP 129/83 mmHg  Pulse 73  Ht 5\' 5"  (1.651 m)  Wt 193 lb (87.544 kg)  BMI 32.12 kg/m2  Constitutional: VS see above. General Appearance: alert, well-developed, well-nourished, NAD  Eyes: Normal lids and  conjunctive, non-icteric sclera  Ears, Nose, Mouth, Throat: MMM, Normal external inspection ears/nares/mouth/lips/gums. TM normal bilaterally. Pharynx/tonsils no erythema, no exudate. Nasal mucosa normal.   Neck: No masses, trachea midline.  Respiratory: Normal respiratory effort. no wheeze, no rhonchi, no rales  Cardiovascular: S1/S2 normal, no murmur, no rub/gallop auscultated. RRR. No lower extremity edema.  Musculoskeletal: Gait normal. No clubbing/cyanosis of digits. .   Skin: warm, dry, intact. Urticarial rash on forearms, no other rash/ulcer. No concerning nevi or subq nodules on limited exam.    Psychiatric: Normal judgment/insight. Normal mood and affect. Oriented x3.    No results found for this or any previous visit (from the past 72 hour(s)).    ASSESSMENT/PLAN:   Type 2 diabetes mellitus without complication, without long-term current use of insulin (HCC) - A1c indicates good control, continue metformin. Will address preventive care measures further at annual wellness - Plan: POCT glycosylated hemoglobin (Hb A1C)  Essential hypertension - Can discontinue amlodipine, blood pressure is pretty well controlled on 2 days off of medications, encouraged weight loss  Viral URI with cough - Recent travel/airport, also has diagnosis of asthma on her previous problem list. Treat symptomatic, RTC if no better/worse - Plan: benzonatate (TESSALON) 200 MG capsule  Rash and nonspecific skin eruption - Consistent with contact dermatitis/possible food allergy, antihistamines advised, RTC if no better/worse  Atypical squamous cells of undetermined significance on cytologic smear of cervix (ASC-US) - Follow-up with repeat Pap at annual wellness     Visit summary with medication list and pertinent instructions was printed for patient to review. All questions at time of visit were answered - patient instructed to contact office with any additional concerns. ER/RTC precautions were reviewed  with the patient. Follow-up plan: Return in about 3 months (around 06/10/2016) for [employer].

## 2016-03-22 ENCOUNTER — Telehealth: Payer: Self-pay

## 2016-03-22 MED ORDER — GUAIFENESIN-CODEINE 100-10 MG/5ML PO SYRP
5.0000 mL | ORAL_SOLUTION | Freq: Three times a day (TID) | ORAL | 0 refills | Status: DC | PRN
Start: 2016-03-22 — End: 2016-05-03

## 2016-03-22 NOTE — Telephone Encounter (Signed)
Prescription sent. Please make patient aware I sent the medicine in, postviral cough syndrome can persist for several weeks, if she is still having problems with this over the course of the next week or 2, she will need to come in to the office for a visit - talk about options for management, repeat lung exam and consider chest x-ray

## 2016-03-22 NOTE — Telephone Encounter (Signed)
Tessmer states she still has a cough at night. Denies fever, chills, wheezing or sweats. She would like a cough medication with codeine. Please advise.

## 2016-03-22 NOTE — Telephone Encounter (Signed)
Patient advised.

## 2016-03-24 MED FILL — OMEPRAZOLE DR 40 MG CAPSULE: 40 | 90 days supply | Qty: 90 | Fill #0

## 2016-03-29 MED FILL — metFORMIN HCL 500 MG TABS: 500 | 30 days supply | Qty: 60 | Fill #1

## 2016-04-13 MED FILL — TRUE METRIX GLUCOSE TEST ST: 50 days supply | Qty: 100 | Fill #2

## 2016-04-18 ENCOUNTER — Ambulatory Visit: Payer: 59 | Admitting: *Deleted

## 2016-05-02 MED FILL — metFORMIN HCL 500 MG TABS: 500 | 30 days supply | Qty: 60 | Fill #2

## 2016-05-02 MED FILL — POTASSIUM CL ER 10 MEQ TAB: 10 | 30 days supply | Qty: 30 | Fill #2

## 2016-05-03 ENCOUNTER — Encounter: Payer: Self-pay | Admitting: Osteopathic Medicine

## 2016-05-03 ENCOUNTER — Ambulatory Visit (INDEPENDENT_AMBULATORY_CARE_PROVIDER_SITE_OTHER): Payer: 59 | Admitting: Osteopathic Medicine

## 2016-05-03 VITALS — BP 135/64 | HR 64 | Temp 97.9°F | Resp 16 | Ht 65.0 in | Wt 191.0 lb

## 2016-05-03 DIAGNOSIS — I10 Essential (primary) hypertension: Secondary | ICD-10-CM | POA: Diagnosis not present

## 2016-05-03 NOTE — Patient Instructions (Signed)
Would recommend you bring your home blood pressure machine to the office so we can verify to with our equipment. If numbers are accurate we can have you measure your pressure at home a few times per week. If it's not accurate, we will just go by what our equipment is measuring, stay on the one medication for now.

## 2016-05-03 NOTE — Progress Notes (Signed)
HPI: Leah Nash is a 60 y.o. female  who presents to [facility] today, 05/03/16,  for chief complaint of:  Chief Complaint  Patient presents with  . Hypertension    BP concern:  . Context: recheck BP, checking at home was high. We stopped the amlodipine last visit since BP was in normal range off meds for 2 days and pt had lost significant amount of weight. Just on Diazide now but took some of the leftover Amlodipine yesterday.  . Quality: BP ok in office at this time, BP with home machine was 155/115 . Assoc signs/symptoms: headache had been bothering her on and off, but nothing over past day or so.     Past medical, surgical, social and family history reviewed: Past Medical History:  Diagnosis Date  . Diabetes mellitus without complication ([place])   . GERD (gastroesophageal reflux disease)   . Hypertension    Past Surgical History:  Procedure Laterality Date  . none     Social History  Substance Use Topics  . Smoking status: Never Smoker  . Smokeless tobacco: Never Used  . Alcohol use No   Family History  Problem Relation Age of Onset  . Cancer Mother   . Alcohol abuse Father   . Depression Daughter   . Diabetes Maternal Aunt      Current medication list and allergy/intolerance information reviewed:   Current Outpatient Prescriptions  Medication Sig Dispense Refill  . benzonatate (TESSALON) 200 MG capsule Take 1 capsule (200 mg total) by mouth 3 (three) times daily as needed for cough. 30 capsule 0  . fish oil-omega-3 fatty acids 1000 MG capsule Take 2 g by mouth daily.    Marland Kitchen guaiFENesin-codeine (ROBITUSSIN AC) 100-10 MG/5ML syrup Take 5 mLs by mouth 3 (three) times daily as needed for cough. Caution, can cause sedation 118 mL 0  . metFORMIN (GLUCOPHAGE) 500 MG tablet Take 500 mg by mouth 2 (two) times daily with a meal.    . naproxen (NAPROSYN) 500 MG tablet Take 1 tablet (500 mg total) by mouth 2 (two) times daily with a  meal. 60 tablet 3  . omeprazole (PRILOSEC) 40 MG capsule Take 40 mg by mouth daily.    . Potassium 75 MG TABS Take by mouth.    . triamterene-hydrochlorothiazide (DYAZIDE) 37.5-25 MG per capsule Take 1 capsule by mouth every morning.     No current facility-administered medications for this visit.    Allergies  Allergen Reactions  . Food     soy      Review of Systems:.   HEENT: No  headache, no vision change, no hearing change, No sore throat, No  sinus pressure  Cardiac: No  chest pain, No  pressure, No palpitations  Respiratory:  No  shortness of breath.  Gastrointestinal: No  abdominal pain, No  nausea, No  vomiting,   Neurologic: No  weakness, No  dizziness  Exam:  BP 135/64 (BP Location: Right Arm, Patient Position: Sitting, Cuff Size: Large)   Pulse 64   Temp 97.9 F (36.6 C) (Oral)   Resp 16   Ht 5\' 5"  (1.651 m)   Wt 191 lb (86.6 kg)   SpO2 100%   BMI 31.78 kg/m   Constitutional: VS see above. General Appearance: alert, well-developed, well-nourished, NAD  Ears, Nose, Mouth, Throat: MMM  Neck: No masses, trachea midline.   Respiratory: Normal respiratory effort. no wheeze, no rhonchi, no rales  Cardiovascular: S1/S2 normal, no murmur, no  rub/gallop auscultated. RRR. No lower extremity edema.   ASSESSMENT/PLAN:   Essential hypertension Cont Triamterene   Patient Instructions  Would recommend you bring your home blood pressure machine to the office so we can verify to with our equipment. If numbers are accurate we can have you measure your pressure at home a few times per week. If it's not accurate, we will just go by what our equipment is measuring, stay on the one medication for now.     Visit summary with medication list and pertinent instructions was printed for patient to review. All questions at time of visit were answered - patient instructed to contact office with any additional concerns. ER/RTC precautions were reviewed with the patient.  Follow-up plan: Return for verification of home blood pressure cuff with nurse viist in next week or so at your convenience.

## 2016-05-04 MED FILL — LOSARTAN-HCTZ 100-25 MG TAB: 100-25 | 30 days supply | Qty: 30 | Fill #3

## 2016-05-08 ENCOUNTER — Telehealth: Payer: Self-pay

## 2016-05-08 NOTE — Telephone Encounter (Signed)
Pt reports that her BP is still high and wants to know what's next. Please advise.

## 2016-05-08 NOTE — Telephone Encounter (Signed)
As printed on her last AVS: " Return for verification of home blood pressure cuff with nurse visit in next week or so at your convenience." She can make appointment with me, but she needs to bring her home BP cuff to the office. [place].

## 2016-05-09 MED ORDER — AMLODIPINE BESYLATE 5 MG PO TABS: 5.0000 mg | ORAL_TABLET | Freq: Every day | ORAL | 0 refills | Status: DC

## 2016-05-09 NOTE — Addendum Note (Signed)
Addended by: Doree Albee on: 05/09/2016 02:52 PM   Modules accepted: Orders

## 2016-05-09 NOTE — Telephone Encounter (Signed)
ok to restart Amlodipine. Make sure she is scheduled for followup with me and make sure she knows to bring her BP cuff to that visit - if she doesn't have cuff with her she will need to reschedule.

## 2016-05-09 NOTE — Telephone Encounter (Signed)
Pt reports that she had a nurse at her job check her BP and that it was still high.  She stated that she could not come in for visit due to work and school.

## 2016-05-09 NOTE — Telephone Encounter (Signed)
Patient has been informed a refill of Amlodpine was sent to [facility] and patient have been informed to bring blood pressure cuff with her to visit. Rhonda Cunningham,CMA

## 2016-05-09 NOTE — Telephone Encounter (Signed)
Patient called a 2nd time about blood pressure. She stated that she would like to be put back on the previous blood pressure that was discontinued. She complains of having a headache everyday from her blood pressure being high. Please advise. Shooter Tangen,CMA

## 2016-05-12 MED FILL — AMLODIPINE BESYLATE 5 MG TA: 5 | 60 days supply | Qty: 60 | Fill #0

## 2016-05-17 ENCOUNTER — Ambulatory Visit (INDEPENDENT_AMBULATORY_CARE_PROVIDER_SITE_OTHER): Payer: 59 | Admitting: Osteopathic Medicine

## 2016-05-17 DIAGNOSIS — Z5329 Procedure and treatment not carried out because of patient's decision for other reasons: Secondary | ICD-10-CM

## 2016-05-18 DIAGNOSIS — Z5329 Procedure and treatment not carried out because of patient's decision for other reasons: Secondary | ICD-10-CM | POA: Insufficient documentation

## 2016-05-18 DIAGNOSIS — Z91199 Patient's noncompliance with other medical treatment and regimen due to unspecified reason: Secondary | ICD-10-CM | POA: Insufficient documentation

## 2016-05-18 NOTE — Progress Notes (Signed)
NO SHOW 05/17/16 for acute visit with Dr. Sheppard Coil

## 2016-05-24 ENCOUNTER — Other Ambulatory Visit: Payer: Self-pay | Admitting: *Deleted

## 2016-05-24 ENCOUNTER — Ambulatory Visit: Payer: Self-pay | Admitting: *Deleted

## 2016-05-24 NOTE — Patient Outreach (Signed)
Patient was a no call/no show for her 2:00 pm Link To Wellness appointment today. Secure e-mail sent to her [facility] e-mail address requesting she contact this RNCM with dates and times she is available to reschedule. Await patient's response. Barrington Ellison RN,CCM,CDE [place] Management Coordinator Link To Wellness Office Phone [phone] Office Fax [phone]

## 2016-05-25 ENCOUNTER — Encounter: Payer: Self-pay | Admitting: *Deleted

## 2016-06-02 ENCOUNTER — Telehealth: Payer: Self-pay

## 2016-06-02 MED ORDER — FLUTICASONE PROPIONATE 50 MCG/ACT NA SUSP
1.0000 | Freq: Every day | NASAL | 3 refills | Status: DC
Start: 2016-06-02 — End: 2016-06-09

## 2016-06-02 NOTE — Telephone Encounter (Signed)
Patient called request a Rx for Flonase. She stated that pharmacist told her it would be cheaper if she got a Rx for it. Please advise. Kamran Coker,CMA

## 2016-06-02 NOTE — Telephone Encounter (Signed)
Prescription was sent

## 2016-06-09 ENCOUNTER — Telehealth: Payer: Self-pay

## 2016-06-09 MED ORDER — FLUTICASONE PROPIONATE 50 MCG/ACT NA SUSP: 1.0000 | Freq: Every day | NASAL | 3 refills | Status: DC

## 2016-06-09 NOTE — Telephone Encounter (Signed)
Leah Nash called and states she takes OTC flonase for seasonal allergies. She would like a prescription sent in because it will be cheaper. Please advise.

## 2016-06-12 ENCOUNTER — Encounter: Payer: Self-pay | Admitting: Osteopathic Medicine

## 2016-06-12 ENCOUNTER — Telehealth: Payer: Self-pay

## 2016-06-12 ENCOUNTER — Ambulatory Visit (INDEPENDENT_AMBULATORY_CARE_PROVIDER_SITE_OTHER): Payer: 59 | Admitting: Osteopathic Medicine

## 2016-06-12 VITALS — BP 142/79 | HR 86 | Ht 65.0 in | Wt 186.0 lb

## 2016-06-12 DIAGNOSIS — I10 Essential (primary) hypertension: Secondary | ICD-10-CM

## 2016-06-12 DIAGNOSIS — J4520 Mild intermittent asthma, uncomplicated: Secondary | ICD-10-CM

## 2016-06-12 DIAGNOSIS — E119 Type 2 diabetes mellitus without complications: Secondary | ICD-10-CM

## 2016-06-12 DIAGNOSIS — Z23 Encounter for immunization: Secondary | ICD-10-CM | POA: Diagnosis not present

## 2016-06-12 DIAGNOSIS — K5900 Constipation, unspecified: Secondary | ICD-10-CM

## 2016-06-12 DIAGNOSIS — Z0000 Encounter for general adult medical examination without abnormal findings: Secondary | ICD-10-CM

## 2016-06-12 DIAGNOSIS — G4700 Insomnia, unspecified: Secondary | ICD-10-CM

## 2016-06-12 DIAGNOSIS — K219 Gastro-esophageal reflux disease without esophagitis: Secondary | ICD-10-CM

## 2016-06-12 MED ORDER — METFORMIN HCL 1000 MG PO TABS: 500.0000 mg | ORAL_TABLET | Freq: Every day | ORAL | 1 refills | Status: DC

## 2016-06-12 MED ORDER — OMEPRAZOLE 40 MG PO CPDR: 40.0000 mg | DELAYED_RELEASE_CAPSULE | Freq: Every day | ORAL | 1 refills | Status: DC

## 2016-06-12 MED ORDER — POTASSIUM 75 MG PO TABS: 1.0000 | ORAL_TABLET | Freq: Every day | ORAL | 6 refills | Status: DC

## 2016-06-12 MED ORDER — POTASSIUM 99 MG PO TABS: 1.0000 | ORAL_TABLET | Freq: Every day | ORAL | 1 refills | Status: DC

## 2016-06-12 MED ORDER — AMLODIPINE BESYLATE 5 MG PO TABS: 5.0000 mg | ORAL_TABLET | Freq: Every day | ORAL | 1 refills | Status: DC

## 2016-06-12 MED ORDER — TRIAMTERENE-HCTZ 37.5-25 MG PO CAPS: 1.0000 | ORAL_CAPSULE | ORAL | 1 refills | Status: DC

## 2016-06-12 MED FILL — metFORMIN HCL 1000 MG TABS: 1000 | 90 days supply | Qty: 45 | Fill #0

## 2016-06-12 NOTE — Patient Instructions (Signed)
Insomnia Insomnia is a sleep disorder that makes it difficult to fall asleep or to stay asleep. Insomnia can cause tiredness (fatigue), low energy, difficulty concentrating, mood swings, and poor performance at work or school.  There are three different ways to classify insomnia:  Difficulty falling asleep.  Difficulty staying asleep.  Waking up too early in the morning. Any type of insomnia can be long-term (chronic) or short-term (acute). Both are common. Short-term insomnia usually lasts for three months or less. Chronic insomnia occurs at least three times a week for longer than three months. CAUSES  Insomnia may be caused by another condition, situation, or substance, such as:  Anxiety.  Certain medicines.  Gastroesophageal reflux disease (GERD) or other gastrointestinal conditions.  Asthma or other breathing conditions.  Restless legs syndrome, sleep apnea, or other sleep disorders.  Chronic pain.  Menopause. This may include hot flashes.  Stroke.  Abuse of alcohol, tobacco, or illegal drugs.  Depression.  Caffeine.   Neurological disorders, such as Alzheimer disease.  An overactive thyroid (hyperthyroidism). The cause of insomnia may not be known. RISK FACTORS Risk factors for insomnia include:  Gender. Women are more commonly affected than men.  Age. Insomnia is more common as you get older.  Stress. This may involve your professional or personal life.  Income. Insomnia is more common in people with lower income.  Lack of exercise.   Irregular work schedule or night shifts.  Traveling between different time zones. SIGNS AND SYMPTOMS If you have insomnia, trouble falling asleep or trouble staying asleep is the main symptom. This may lead to other symptoms, such as:  Feeling fatigued.  Feeling nervous about going to sleep.  Not feeling rested in the morning.  Having trouble concentrating.  Feeling irritable, anxious, or depressed. TREATMENT   Treatment for insomnia depends on the cause. If your insomnia is caused by an underlying condition, treatment will focus on addressing the condition. Treatment may also include:   Medicines to help you sleep.  Counseling or therapy.  Lifestyle adjustments. HOME CARE INSTRUCTIONS   Take medicines only as directed by your health care provider.  Keep regular sleeping and waking hours. Avoid naps.  Keep a sleep diary to help you and your health care provider figure out what could be causing your insomnia. Include:   When you sleep.  When you wake up during the night.  How well you sleep.   How rested you feel the next day.  Any side effects of medicines you are taking.  What you eat and drink.   Make your bedroom a comfortable place where it is easy to fall asleep:  Put up shades or special blackout curtains to block light from outside.  Use a white noise machine to block noise.  Keep the temperature cool.   Exercise regularly as directed by your health care provider. Avoid exercising right before bedtime.  Use relaxation techniques to manage stress. Ask your health care provider to suggest some techniques that may work well for you. These may include:  Breathing exercises.  Routines to release muscle tension.  Visualizing peaceful scenes.  Cut back on alcohol, caffeinated beverages, and cigarettes, especially close to bedtime. These can disrupt your sleep.  Do not overeat or eat spicy foods right before bedtime. This can lead to digestive discomfort that can make it hard for you to sleep.  Limit screen use before bedtime. This includes:  Watching TV.  Using your smartphone, tablet, and computer.  Stick to a routine. This   can help you fall asleep faster. Try to do a quiet activity, brush your teeth, and go to bed at the same time each night.  Get out of bed if you are still awake after 15 minutes of trying to sleep. Keep the lights down, but try reading or  doing a quiet activity. When you feel sleepy, go back to bed.  Make sure that you drive carefully. Avoid driving if you feel very sleepy.  Keep all follow-up appointments as directed by your health care provider. This is important. SEEK MEDICAL CARE IF:   You are tired throughout the day or have trouble in your daily routine due to sleepiness.  You continue to have sleep problems or your sleep problems get worse. SEEK IMMEDIATE MEDICAL CARE IF:   You have serious thoughts about hurting yourself or someone else.   This information is not intended to replace advice given to you by your health care provider. Make sure you discuss any questions you have with your health care provider.   Document Released: 08/11/2000 Document Revised: 05/05/2015 Document Reviewed: 05/15/2014 Elsevier Interactive Patient Education 2016 Elsevier Inc.  

## 2016-06-12 NOTE — Addendum Note (Signed)
Addended by: Doree Albee on: 06/12/2016 09:22 AM   Modules accepted: Orders

## 2016-06-12 NOTE — Telephone Encounter (Signed)
Monica from [facility] called stated that patient Rx for Potassium 75 mg is not available. She said the closest dose that is available is the 51 mch. She wants to know if a Rx can be sent in for that. Please advise send message back to whoever will be covering for me. Carmesha Morocco,CMA

## 2016-06-12 NOTE — Progress Notes (Signed)
HPI: Leah Nash is a 60 y.o. female  who presents to [facility] today, 06/12/16,  for chief complaint of:  Chief Complaint  Patient presents with  . Annual Exam      DIABETES SCREENING/PREVENTIVE CARE: A1C past 3-6 mos: Yes  controlled? 6.4% 03/10/16, down from 7.3% 11/15/15 (see Care Everywhere [facility])  BP goal <140/90: Yes  LDL goal <70: No  Eye exam annually: Yes , importance discussed with patient Foot exam: Yes 06/12/16  Microalbuminuria:No  Metformin: Yes  ACE/ARB: No  Antiplatelet if ASCVD Risk >10%: await labs Statin: No   FEMALE PREVENTIVE CARE  ANNUAL SCREENING/COUNSELING Tobacco - noNever  Alcohol - none Diet/Exercise - HEALTHY HABITS DISCUSSED TO DECREASE CV RISK Depression - PQH2 Negative Domestic violence concerns - no HTN SCREENING - SEE VITALS Vaccination status - SEE [place] Sexually active in the past year - no With - No STI - The patient denies history of sexually transmitted disease. STI testing today? - no  INFECTIOUS DISEASE SCREENING HIV - all adults 15-65 - does not need GC/CT - sexually active - does not need HepC - DOB [birth year] - does not need TB - does not need  DISEASE SCREENING Lipid - needs DM2 - does not need Osteoporosis - does not need  CANCER SCREENING Cervical - needs - last Pap ASCUS Breast - needs Lung - does not need Colon - does not need, 09/28/2011 at [place], due in 5 years   ADULT VACCINATION Influenza - already has Td - was given HPV - was not indicated Zoster - was not indicated Pneumonia - was not indicated - has for DM2   Past medical, surgical, social and family history reviewed: Past Medical History:  Diagnosis Date  . Diabetes mellitus without complication ([place])   . GERD (gastroesophageal reflux disease)   . Hypertension    Past Surgical History:  Procedure Laterality Date  . none     Social History  Substance Use Topics  . Smoking status:  Never Smoker  . Smokeless tobacco: Never Used  . Alcohol use No   Family History  Problem Relation Age of Onset  . Cancer Mother   . Alcohol abuse Father   . Depression Daughter   . Diabetes Maternal Aunt      Current medication list and allergy/intolerance information reviewed:   Current Outpatient Prescriptions  Medication Sig Dispense Refill  . amLODipine (NORVASC) 5 MG tablet Take 1 tablet (5 mg total) by mouth daily. 60 tablet 0  . fish oil-omega-3 fatty acids 1000 MG capsule Take 2 g by mouth daily.    . fluticasone (FLONASE) 50 MCG/ACT nasal spray Place 1-2 sprays into both nostrils daily. 16 g 3  . metFORMIN (GLUCOPHAGE) 500 MG tablet Take 500 mg by mouth 2 (two) times daily with a meal.    . naproxen (NAPROSYN) 500 MG tablet Take 1 tablet (500 mg total) by mouth 2 (two) times daily with a meal. 60 tablet 3  . omeprazole (PRILOSEC) 40 MG capsule Take 40 mg by mouth daily.    . Potassium 75 MG TABS Take by mouth.    . triamterene-hydrochlorothiazide (DYAZIDE) 37.5-25 MG per capsule Take 1 capsule by mouth every morning.     No current facility-administered medications for this visit.    Allergies  Allergen Reactions  . Food     soy      Review of Systems:  Constitutional:  No  fever, no chills, No recent  illness, No unintentional weight changes. No significant fatigue.   HEENT: No  headache, no vision change, no hearing change, No sore throat, No  sinus pressure  Cardiac: No  chest pain, No  pressure, No palpitations, No  Orthopnea  Respiratory:  No  shortness of breath.   Gastrointestinal: No  abdominal pain, No  nausea, No  vomiting,  No  blood in stool, No  diarrhea, +constipation   Musculoskeletal: No new myalgia/arthralgia  Genitourinary: No  incontinence, No  abnormal genital bleeding, No abnormal genital discharge  Skin: No  Rash  Neurologic: No  weakness, No  dizziness  Psychiatric: No  concerns with depression, No  concerns with anxiety, +sleep  problems, No mood problems  Exam:  BP (!) 142/79   Pulse 86   Ht 5\' 5"  (1.651 m)   Wt 186 lb (84.4 kg)   BMI 30.95 kg/m   Constitutional: VS see above. General Appearance: alert, well-developed, well-nourished, NAD  Eyes: Normal lids and conjunctive, non-icteric sclera  Ears, Nose, Mouth, Throat: MMM, Normal external inspection ears/nares/mouth/lips/gums. TM normal bilaterally. Pharynx/tonsils no erythema, no exudate. Nasal mucosa normal.   Neck: No masses, trachea midline. No thyroid enlargement. No tenderness/mass appreciated. No lymphadenopathy  Respiratory: Normal respiratory effort. no wheeze, no rhonchi, no rales  Cardiovascular: S1/S2 normal, no murmur, no rub/gallop auscultated. RRR. No lower extremity edema.   Gastrointestinal: Nontender, no masses. No hepatomegaly, no splenomegaly. No hernia appreciated. Bowel sounds normal. Rectal exam deferred.   Musculoskeletal: Gait normal. No clubbing/cyanosis of digits.   Neurological:  Normal balance/coordination. No tremor.   Skin: warm, dry, intact. No rash/ulcer.  Psychiatric: Normal judgment/insight. Normal mood and affect. Oriented x3.     ASSESSMENT/PLAN:   Annual physical exam - Plan: CBC with Differential/Platelet, COMPLETE METABOLIC PANEL WITH GFR, HIV antibody, Hepatitis C antibody, Lipid panel, TSH, VITAMIN D 25 Hydroxy (Vit-D Deficiency, Fractures), MS DIGITAL SCREENING BILATERAL  Benign essential HTN - Plan: triamterene-hydrochlorothiazide (DYAZIDE) 37.5-25 MG capsule, amLODipine (NORVASC) 5 MG tablet, Potassium 75 MG TABS  Mild intermittent asthma without complication  Controlled type 2 diabetes mellitus without complication, without long-term current use of insulin (HCC) - Plan: metFORMIN (GLUCOPHAGE) 1000 MG tablet  Gastroesophageal reflux disease, esophagitis presence not specified - Plan: omeprazole (PRILOSEC) 40 MG capsule  Insomnia, unspecified type - Home injections printed, make separate follow-up  visit to discuss this further if still having problems  Constipation, unspecified constipation type - Increase hydration, exercise, fiber intake. Daily stool softener recommended     Visit summary with medication list and pertinent instructions was printed for patient to review. All questions at time of visit were answered - patient instructed to contact office with any additional concerns. ER/RTC precautions were reviewed with the patient. Follow-up plan: Return in about 3 months (around 09/12/2016) for PAP AND DIABETES FOLLOWUP (OV30), sooner if needed.

## 2016-06-12 NOTE — Telephone Encounter (Signed)
Called an alternative potassium. Labs currently pending

## 2016-06-13 LAB — CBC WITH DIFFERENTIAL/PLATELET
BASOS PCT: 1 %
Basophils Absolute: 44 cells/uL (ref 0–200)
EOS PCT: 3 %
Eosinophils Absolute: 132 cells/uL (ref 15–500)
HCT: 40.7 % (ref 35.0–45.0)
Hemoglobin: 13.5 g/dL (ref 11.7–15.5)
LYMPHS PCT: 43 %
Lymphs Abs: 1892 cells/uL (ref 850–3900)
MCH: 25.3 pg — ABNORMAL LOW (ref 27.0–33.0)
MCHC: 33.2 g/dL (ref 32.0–36.0)
MCV: 76.2 fL — ABNORMAL LOW (ref 80.0–100.0)
MONOS PCT: 5 %
MPV: 11.6 fL (ref 7.5–12.5)
Monocytes Absolute: 220 cells/uL (ref 200–950)
NEUTROS ABS: 2112 {cells}/uL (ref 1500–7800)
Neutrophils Relative %: 48 %
PLATELETS: 167 10*3/uL (ref 140–400)
RBC: 5.34 MIL/uL — AB (ref 3.80–5.10)
RDW: 15.3 % — AB (ref 11.0–15.0)
WBC: 4.4 10*3/uL (ref 3.8–10.8)

## 2016-06-13 LAB — LIPID PANEL
CHOL/HDL RATIO: 2.9 ratio (ref <=5.0)
CHOLESTEROL: 213 mg/dL — AB (ref 125–200)
HDL: 73 mg/dL (ref >=46)
LDL Cholesterol: 127 mg/dL (ref <130)
Triglycerides: 63 mg/dL (ref <150)
VLDL: 13 mg/dL (ref <30)

## 2016-06-13 LAB — COMPLETE METABOLIC PANEL WITH GFR
ALT: 16 U/L (ref 6–29)
AST: 20 U/L (ref 10–35)
Albumin: 4.2 g/dL (ref 3.6–5.1)
Alkaline Phosphatase: 54 U/L (ref 33–130)
BILIRUBIN TOTAL: 0.7 mg/dL (ref 0.2–1.2)
BUN: 15 mg/dL (ref 7–25)
CHLORIDE: 97 mmol/L — AB (ref 98–110)
CO2: 32 mmol/L — AB (ref 20–31)
CREATININE: 0.72 mg/dL (ref 0.50–1.05)
Calcium: 9.5 mg/dL (ref 8.6–10.4)
GFR, Est Non African American: >89 mL/min (ref >=60)
GLUCOSE: 113 mg/dL — AB (ref 65–99)
Potassium: 3.7 mmol/L (ref 3.5–5.3)
SODIUM: 139 mmol/L (ref 135–146)
TOTAL PROTEIN: 7.9 g/dL (ref 6.1–8.1)

## 2016-06-13 LAB — HIV ANTIBODY (ROUTINE TESTING W REFLEX): HIV 1&2 Ab, 4th Generation: NONREACTIVE

## 2016-06-13 LAB — HEPATITIS C ANTIBODY: HCV AB: NEGATIVE

## 2016-06-13 LAB — TSH: TSH: 0.83 m[IU]/L

## 2016-06-13 LAB — VITAMIN D 25 HYDROXY (VIT D DEFICIENCY, FRACTURES): VIT D 25 HYDROXY: 36 ng/mL (ref 30–100)

## 2016-06-14 NOTE — Progress Notes (Signed)
Please try close the encounter with a with LOS "nc1" charge   Thanks

## 2016-06-15 ENCOUNTER — Other Ambulatory Visit: Payer: Self-pay | Admitting: Osteopathic Medicine

## 2016-06-15 DIAGNOSIS — N6002 Solitary cyst of left breast: Secondary | ICD-10-CM

## 2016-06-15 NOTE — Progress Notes (Signed)
It worked. Thank you

## 2016-06-16 ENCOUNTER — Telehealth: Payer: Self-pay | Admitting: Osteopathic Medicine

## 2016-06-16 DIAGNOSIS — I10 Essential (primary) hypertension: Secondary | ICD-10-CM

## 2016-06-16 MED FILL — TRUE METRIX GLUCOSE TEST ST: 50 days supply | Qty: 100 | Fill #3

## 2016-06-16 MED FILL — OMEPRAZOLE DR 40 MG CAPSULE: 40 | 90 days supply | Qty: 90 | Fill #0

## 2016-06-16 MED FILL — FLUTICASONE PROP 50 MCG SPR: 50 | 30 days supply | Qty: 16 | Fill #0

## 2016-06-16 NOTE — Telephone Encounter (Signed)
Received call from [facility] regarding Pt's BP Rx. Pt reports taking Hyzaar (Losartan 100mg  and HCTZ 25mg ) combo not the Dyazide. Will route to PCP to see which Rx is preferred. Pt prefers to stay on Hyzaar. Will route.

## 2016-06-18 MED ORDER — LOSARTAN POTASSIUM-HCTZ 100-25 MG PO TABS: 1.0000 | ORAL_TABLET | Freq: Every day | ORAL | 3 refills | Status: DC

## 2016-06-18 NOTE — Telephone Encounter (Signed)
Okay, I sent in the Hyzaar. It looks like we had Dyazide on the patient's medication list so that's what I went ahead and refilled. Whichever she is currently taking is fine. I removed Dyazide from the medication list.

## 2016-06-19 MED FILL — LOSARTAN-HCTZ 100-25 MG TAB: 100-25 | 90 days supply | Qty: 90 | Fill #0

## 2016-06-19 NOTE — Telephone Encounter (Signed)
Pt advised of Rx, verbalized understanding.

## 2016-06-26 ENCOUNTER — Other Ambulatory Visit: Payer: Self-pay | Admitting: *Deleted

## 2016-06-26 ENCOUNTER — Encounter: Payer: Self-pay | Admitting: *Deleted

## 2016-06-26 NOTE — Patient Outreach (Signed)
[facility]) Care Management   06/26/2016  Leah Nash Jul 02, 1956 [id number]  Leah Nash is an 60 y.o. female who presents to the [facility] office for routine Link To Wellness follow up for self management assistance with Type II DM, HTN and obesity.  Subjective:  "Leah Nash" says she has significantly changed her meal plan since her last visit and has eliminated concentrated sweets and products with sugar more than 10 gm per serving. She says she wants to stop Metformin and control her diabetes through lifestyle changes.  She says she is not participating in a formal exercise program but she does  walk a great distance when she is in class at [employer]. She says she purposely parks far way and carries a heavy backpack on her back. She says once she is on break from school , she will resume [facility]  She says she saw her primary care provider on 10/16 but does not know the results of her blood work.  She says she will return again on 09/12/16. She does complain of intermittent leg cramps at night and is wondering if her potassium level is low as this was the cause when she experienced the cramps in the past.  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   Objective:   Review of Systems  Constitutional: Negative.     Physical Exam  Constitutional: She is oriented to person, place, and time. She appears well-developed and well-nourished.  Respiratory: Effort normal.  Neurological: She is alert and oriented to person, place, and time.  Skin: Skin is warm and dry.  Psychiatric: She has a normal mood and affect. Her behavior is normal. Judgment and thought content normal.   Vitals:   06/26/16 0838  BP: 102/70   Filed Weights   06/26/16 0838  Weight: 188 lb 12.8 oz (85.6 kg)    Encounter Medications:   Outpatient  Encounter Prescriptions as of 01/20/2016  Medication Sig Note  . amLODipine (NORVASC) 5 MG tablet Take 5 mg by mouth daily.   . fish oil-omega-3 fatty acids 1000 MG capsule Take 2 g by mouth daily.   Marland Kitchen omeprazole (PRILOSEC) 40 MG capsule Take 40 mg by mouth daily. 01/20/2016: Takes prn  . triamterene-hydrochlorothiazide (DYAZIDE) 37.5-25 MG per capsule Take 1 capsule by mouth every morning.   . [DISCONTINUED] potassium chloride (K-DUR,KLOR-CON) 10 MEQ tablet Take 10 mEq by mouth 2 (two) times daily.   . naproxen (NAPROSYN) 500 MG tablet Take 1 tablet (500 mg total) by mouth 2 (two) times daily with a meal. (Patient not taking: Reported on 01/20/2016)    Metformin  500 mg twice daily   . Potassium 75 MG TABS Take by mouth. 01/20/2016: Received from: [facility]   No facility-administered encounter medications on file as of 01/20/2016.    Functional Status:   In your present state of health, do you have any difficulty performing the following activities: 06/26/2016 01/20/2016  Hearing? N N  Vision? N N  Difficulty concentrating or making decisions? N N  Walking or climbing stairs? N N  Dressing or bathing? N N  Doing errands, shopping? N N  Preparing Food and eating ? N N  Using the Toilet? N N  In the past six months, have you accidently leaked urine? N N  Do you have problems  with loss of bowel control? N N  Managing your Medications? N N  Managing your Finances? N N  Housekeeping or managing your Housekeeping? N N  Some recent data might be hidden    Fall/Depression Screening:    PHQ 2/9 Scores 01/20/2016 12/07/2015  PHQ - 2 Score 3 0  PHQ- 9 Score 6 -    Assessment:   [employer] employee with Type II DM, HTN and obesity showing improvement in all chronic disease states.  Plan:  [facility] CM Care Plan Problem One        Most Recent Value   Care Plan Problem One  Type II DM, HTN showing improvement in all chronic disease states since last office visit   Role Documenting the Problem  One  Care Management Coordinator   Care Plan for Problem One  Active   [facility] Long Term Goal (31-90 days)  Ongoing good glycemic control as evidenced by Hgb A1C of <6.5% without DM medications, ongoing good control of HTN and ongoing weight loss or no weight gain at next assessment   [facility]N Long Term Goal Start Date  06/26/16   Interventions for Problem One Long Term Goal  Congratulated Leah Nash on her heathy lifestyle changes and her weight loss, reviewed all lab results of labs done 06/12/16 and answered her questions, reviewed patient's medications and assessed medication adherence, discussed DM medication of Metformin including the mechanism of action, common side effects, dosages and dosing schedule, advised Leah Nash that with consistent heathy lifestyle changes, she should be able to stop the Metformin since she is on a low dose and her Hgb A1C was surpassing target prior to her weight loss, reinforced importance of taking all medications as prescribed, reviewed meal plan,  discussed recommended daily amounts of fiber (30 gm) and congratulated Leah Nash on taking a daily fiber supplement,  and protein- 66 for adult women and how these two macronutrients help with satiety, reviewed effects of physical activity on glucose levels and long-term glucose control by improving insulin sensitivity and assisting with weight management and cardiovascular health, encouraged patient to continue to exercise, discussed importance of blood glucose monitoring and interpretation in assisting with DM management, reviewed recommended target ranges for pre-meal and post-meal and encouraged Leah Nash to test blood sugars more frequently to identify foods and drinks that might elevate her blood sugars above target, reviewed recommended daily foot checks, and yearly cholesterol, urine, and eye testing, and recommendations for medical, dental, and emotional self-care, discussed HTN treatment goals and issued voucher for home BP monitor for $5 at a [facility]  pharmacy, educated Leah Nash to the 2018 [employer] and how to set up password, assisted Leah Nash with enrolling in [place] and advised her this will replace the Link To Wellness program for her in 2018     RNCM to fax today's office visit note to patient's primary care provider.   Barrington Ellison RN,CCM,CDE [place] Management Coordinator Link To Wellness Office Phone [phone] Office Fax [phone]

## 2016-07-07 ENCOUNTER — Other Ambulatory Visit: Payer: Self-pay | Admitting: Osteopathic Medicine

## 2016-07-07 DIAGNOSIS — I10 Essential (primary) hypertension: Secondary | ICD-10-CM

## 2016-07-14 MED FILL — FLUTICASONE PROP 50 MCG SPR: 50 | 30 days supply | Qty: 16 | Fill #1

## 2016-08-31 ENCOUNTER — Other Ambulatory Visit: Payer: Self-pay

## 2016-09-05 ENCOUNTER — Ambulatory Visit: Payer: 59 | Admitting: Physician Assistant

## 2016-09-12 ENCOUNTER — Ambulatory Visit: Payer: 59 | Admitting: Osteopathic Medicine

## 2016-09-19 MED FILL — LOSARTAN-HCTZ 100-25 MG TAB: 100-25 | 90 days supply | Qty: 90 | Fill #1

## 2016-09-19 MED FILL — metFORMIN HCL 1000 MG TABS: 1000 | 90 days supply | Qty: 45 | Fill #1

## 2016-09-19 MED FILL — FLUTICASONE PROP 50 MCG SPR: 50 | 30 days supply | Qty: 16 | Fill #2

## 2016-09-19 MED FILL — OMEPRAZOLE DR 40 MG CAPSULE: 40 | 90 days supply | Qty: 90 | Fill #1

## 2016-09-20 ENCOUNTER — Other Ambulatory Visit: Payer: Self-pay

## 2016-09-20 ENCOUNTER — Encounter: Payer: Self-pay | Admitting: Osteopathic Medicine

## 2016-09-20 ENCOUNTER — Ambulatory Visit (INDEPENDENT_AMBULATORY_CARE_PROVIDER_SITE_OTHER): Payer: 59 | Admitting: Osteopathic Medicine

## 2016-09-20 VITALS — BP 134/83 | HR 82 | Ht 65.0 in | Wt 188.0 lb

## 2016-09-20 DIAGNOSIS — J209 Acute bronchitis, unspecified: Secondary | ICD-10-CM | POA: Diagnosis not present

## 2016-09-20 MED ORDER — TRUE METRIX BLOOD GLUCOSE TEST VI STRP: ORAL_STRIP | 5 refills | Status: DC

## 2016-09-20 MED ORDER — FLUTICASONE-SALMETEROL 250-50 MCG/DOSE IN AEPB: 1.0000 | INHALATION_SPRAY | Freq: Two times a day (BID) | RESPIRATORY_TRACT | 1 refills | Status: DC

## 2016-09-20 MED ORDER — BENZONATATE 200 MG PO CAPS: 200.0000 mg | ORAL_CAPSULE | Freq: Three times a day (TID) | ORAL | 0 refills | Status: DC | PRN

## 2016-09-20 MED ORDER — AZITHROMYCIN 250 MG PO TABS: ORAL_TABLET | ORAL | 0 refills | Status: DC

## 2016-09-20 MED FILL — BENZONATATE 200 MG CAP: 200 | 10 days supply | Qty: 30 | Fill #0

## 2016-09-20 MED FILL — ADVAIR 250/50 DISKUS: 250-50 | 30 days supply | Qty: 60 | Fill #0

## 2016-09-20 MED FILL — AZITHROMYCIN 250 MG TABLET: 250 | 5 days supply | Qty: 6 | Fill #0

## 2016-09-20 NOTE — Telephone Encounter (Signed)
Patient request refill for True mrtrix test strips. #[phone] R sent to [facility]. Rhonda Cunningham,CMA

## 2016-09-20 NOTE — Patient Instructions (Signed)

## 2016-09-20 NOTE — Progress Notes (Signed)
HPI: Leah Nash is a 61 y.o. female who presents to [facility] 09/20/16 for chief complaint of:  Chief Complaint  Patient presents with  . Cough    Acute Illness: . Quality: Sleep dry cough but occasionally somewhat productive . Assoc signs/symptoms: see ROS . Duration: 4 weeks total    Past medical, social and family history reviewed. Current medications and allergies reviewed.     Review of Systems:  Constitutional: No  fever/chills  HEENT: Yes  headache, No  sore throat, No  swollen glands  Cardiovascular: No chest pain  Respiratory:Yes  cough, No  shortness of breath  Gastrointestinal: No  nausea, No  vomiting,  No  diarrhea  Musculoskeletal:   No  myalgia/arthralgia  Skin/Integument:  No  rash   Exam:  BP (!) 152/86   Pulse 76   Ht 5\' 5"  (1.651 m)   Wt 188 lb (85.3 kg)   BMI 31.28 kg/m   Constitutional: VSS, see above. General Appearance: alert, well-developed, well-nourished, NAD  Eyes: Normal lids and conjunctive, non-icteric sclera, PERRLA  Ears, Nose, Mouth, Throat: Normal external inspection ears/nares/mouth/lips/gums, normal TM, MMM; posterior pharynx without erythema, without exudate, nasal mucosa normal  Neck: No masses, trachea midline. normal lymph nodes  Respiratory: Normal respiratory effort. Yes  Wheeze left upper lobe, otherwise clear lungs  Cardiovascular: S1/S2 normal, no murmur/rub/gallop auscultated. RRR.    ASSESSMENT/PLAN: Treat empirically for bronchitis/possible pneumonia, mild illness without fever or shortness of breath don't think necessitates a chest x-ray at this time though patient is advised if no improvement on 1-2 days of medications/if worse or fever develops please let us know we may need to escalate antibiotic therapy  Acute bronchitis, unspecified organism - Plan: azithromycin (ZITHROMAX) 250 MG tablet, Fluticasone-Salmeterol (ADVAIR DISKUS) 250-50 MCG/DOSE AEPB, benzonatate  (TESSALON) 200 MG capsule     Patient Instructions  Acute Bronchitis, Adult Acute bronchitis is sudden (acute) swelling of the air tubes (bronchi) in the lungs. Acute bronchitis causes these tubes to fill with mucus, which can make it hard to breathe. It can also cause coughing or wheezing. In adults, acute bronchitis usually goes away within 2 weeks. A cough caused by bronchitis may last up to 3 weeks. Smoking, allergies, and asthma can make the condition worse. Repeated episodes of bronchitis may cause further lung problems, such as chronic obstructive pulmonary disease (COPD). What are the causes? This condition can be caused by germs and by substances that irritate the lungs, including:  Cold and flu viruses. This condition is most often caused by the same virus that causes a cold.  Bacteria.  Exposure to tobacco smoke, dust, fumes, and air pollution. What increases the risk? This condition is more likely to develop in people who:  Have close contact with someone with acute bronchitis.  Are exposed to lung irritants, such as tobacco smoke, dust, fumes, and vapors.  Have a weak immune system.  Have a respiratory condition such as asthma. What are the signs or symptoms? Symptoms of this condition include:  A cough.  Coughing up clear, yellow, or green mucus.  Wheezing.  Chest congestion.  Shortness of breath.  A fever.  Body aches.  Chills.  A sore throat. How is this diagnosed? This condition is usually diagnosed with a physical exam. During the exam, your health care provider may order tests, such as chest X-rays, to rule out other conditions. He or she may also:  Test a sample of your mucus for bacterial infection.  Check the level of oxygen in your blood. This is done to check for pneumonia.  Do a chest X-ray or lung function testing to rule out pneumonia and other conditions.  Perform blood tests. Your health care provider will also ask about your  symptoms and medical history. How is this treated? Most cases of acute bronchitis clear up over time without treatment. Your health care provider may recommend:  Drinking more fluids. Drinking more makes your mucus thinner, which may make it easier to breathe.  Taking a medicine for a fever or cough.  Taking an antibiotic medicine.  Using an inhaler to help improve shortness of breath and to control a cough.  Using a cool mist vaporizer or humidifier to make it easier to breathe. Follow these instructions at home: Medicines  Take over-the-counter and prescription medicines only as told by your health care provider.  If you were prescribed an antibiotic, take it as told by your health care provider. Do not stop taking the antibiotic even if you start to feel better. General instructions  Get plenty of rest.  Drink enough fluids to keep your urine clear or pale yellow.  Avoid smoking and secondhand smoke. Exposure to cigarette smoke or irritating chemicals will make bronchitis worse. If you smoke and you need help quitting, ask your health care provider. Quitting smoking will help your lungs heal faster.  Use an inhaler, cool mist vaporizer, or humidifier as told by your health care provider.  Keep all follow-up visits as told by your health care provider. This is important. How is this prevented? To lower your risk of getting this condition again:  Wash your hands often with soap and water. If soap and water are not available, use hand sanitizer.  Avoid contact with people who have cold symptoms.  Try not to touch your hands to your mouth, nose, or eyes.  Make sure to get the flu shot every year. Contact a health care provider if:  Your symptoms do not improve in 2 weeks of treatment. Get help right away if:  You cough up blood.  You have chest pain.  You have severe shortness of breath.  You become dehydrated.  You faint or keep feeling like you are going to  faint.  You keep vomiting.  You have a severe headache.  Your fever or chills gets worse. This information is not intended to replace advice given to you by your health care provider. Make sure you discuss any questions you have with your health care provider. Document Released: 09/21/2004 Document Revised: 03/08/2016 Document Reviewed: 02/02/2016 Elsevier Interactive Patient Education  2017 [employer].     Visit summary was printed for the patient with medications and pertinent instructions for patient to review. ER/RTC precautions reviewed. All questions answered. Return if symptoms worsen or fail to improve.

## 2016-09-21 ENCOUNTER — Other Ambulatory Visit: Payer: 59

## 2016-09-21 ENCOUNTER — Ambulatory Visit: Payer: 59 | Admitting: Osteopathic Medicine

## 2016-09-26 ENCOUNTER — Other Ambulatory Visit: Payer: Self-pay

## 2016-09-26 DIAGNOSIS — N6002 Solitary cyst of left breast: Secondary | ICD-10-CM

## 2016-09-27 ENCOUNTER — Other Ambulatory Visit: Payer: Self-pay | Admitting: Osteopathic Medicine

## 2016-09-27 MED ORDER — GLUCOSE BLOOD VI STRP: ORAL_STRIP | 99 refills | Status: DC

## 2016-09-27 MED ORDER — FREESTYLE LANCETS MISC: 99 refills | Status: AC

## 2016-09-29 ENCOUNTER — Ambulatory Visit: Payer: 59 | Admitting: Osteopathic Medicine

## 2016-10-03 ENCOUNTER — Ambulatory Visit (INDEPENDENT_AMBULATORY_CARE_PROVIDER_SITE_OTHER): Payer: 59 | Admitting: Osteopathic Medicine

## 2016-10-03 DIAGNOSIS — R87610 Atypical squamous cells of undetermined significance on cytologic smear of cervix (ASC-US): Secondary | ICD-10-CM | POA: Insufficient documentation

## 2016-10-03 DIAGNOSIS — Z5329 Procedure and treatment not carried out because of patient's decision for other reasons: Secondary | ICD-10-CM

## 2016-10-03 NOTE — Progress Notes (Signed)
NO SHOW

## 2016-10-10 ENCOUNTER — Other Ambulatory Visit: Payer: 59

## 2016-10-12 ENCOUNTER — Telehealth: Payer: Self-pay

## 2016-10-12 NOTE — Telephone Encounter (Signed)
Patient advised and transferred to schedule an appointment.

## 2016-10-12 NOTE — Telephone Encounter (Signed)
Leah Nash states she has a dry cough and no other symptoms. Denies fever, chills, runny nose or sweats. She wants an antibiotic and refill on Tessalon cough medication. She refused to schedule an appointment. She states she was just seen a few weeks ago. Please advise.

## 2016-10-12 NOTE — Telephone Encounter (Signed)
One course of antibiotics was already prescribed for her. She needs to come in for a chest x-ray and further evaluation if the symptoms are persisting. No further treatment over the phone, sorry.

## 2016-10-19 ENCOUNTER — Ambulatory Visit: Payer: 59 | Admitting: Osteopathic Medicine

## 2016-10-26 ENCOUNTER — Encounter: Payer: Self-pay | Admitting: Osteopathic Medicine

## 2016-10-26 ENCOUNTER — Ambulatory Visit (INDEPENDENT_AMBULATORY_CARE_PROVIDER_SITE_OTHER): Payer: 59 | Admitting: Osteopathic Medicine

## 2016-10-26 VITALS — BP 157/84 | HR 75 | Ht 64.0 in | Wt 186.0 lb

## 2016-10-26 DIAGNOSIS — M25512 Pain in left shoulder: Secondary | ICD-10-CM

## 2016-10-26 DIAGNOSIS — E119 Type 2 diabetes mellitus without complications: Secondary | ICD-10-CM | POA: Diagnosis not present

## 2016-10-26 DIAGNOSIS — G8929 Other chronic pain: Secondary | ICD-10-CM

## 2016-10-26 DIAGNOSIS — I10 Essential (primary) hypertension: Secondary | ICD-10-CM

## 2016-10-26 DIAGNOSIS — E1169 Type 2 diabetes mellitus with other specified complication: Secondary | ICD-10-CM | POA: Diagnosis not present

## 2016-10-26 DIAGNOSIS — E785 Hyperlipidemia, unspecified: Secondary | ICD-10-CM | POA: Diagnosis not present

## 2016-10-26 LAB — POCT GLYCOSYLATED HEMOGLOBIN (HGB A1C): HEMOGLOBIN A1C: 6.9

## 2016-10-26 MED ORDER — NAPROXEN 500 MG PO TABS: 500.0000 mg | ORAL_TABLET | Freq: Two times a day (BID) | ORAL | 3 refills | Status: DC

## 2016-10-26 MED FILL — NAPROXEN 500 MG TABLET: 500 | 30 days supply | Qty: 60 | Fill #0

## 2016-10-26 NOTE — Progress Notes (Signed)
HPI: Leah Nash is a 61 y.o. female  who presents to [facility] today, 10/26/16,  for chief complaint of:  Chief Complaint  Patient presents with  . Follow-up    DIABETES     DIABETES SCREENING/PREVENTIVE CARE: A1C past 3-6 mos: Yes  controlled?   6.4% 03/10/16, down from 7.3% 11/15/15   10/26/16  BP goal <140/90: Yes  LDL goal <100: No  Eye exam annually: Yes , importance discussed with patient Foot exam: Yes 06/12/16  Microalbuminuria:No  Metformin: prescribed but not taking - for the past 1-2 months ACE/ARB: Yes Antiplatelet if ASCVD Risk >10%: await labs  ASCVD 10-year risk: 22.7% - HTN a problem  Statin: No    HTN: Patient is been taking her medications inconsistently over the past few days. He was school and forgets sometimes. No chest pain, pressure, shortness of breath.  Arthritis: Shoulder has been bothering her, requests refill on naproxen.   Past medical history, surgical history, social history and family history reviewed.  Patient Active Problem List   Diagnosis Date Noted  . Atypical squamous cells of undetermined significance on cytologic smear of cervix (ASC-US) 10/03/2016  . Failure to attend appointment 05/18/2016  . Colon, diverticulosis 05/20/2014  . Class 1 obesity 03/04/2013  . Plantar fasciitis, bilateral 02/14/2013  . Type II diabetes mellitus ([place]) 12/29/2011  . Fibroid 01/27/2010  . Benign essential HTN 07/20/2009  . Asthma, mild intermittent 12/23/2008  . External hemorrhoid 11/26/2007    Current medication list and allergy/intolerance information reviewed.   Current Outpatient Prescriptions on File Prior to Visit  Medication Sig Dispense Refill  . Albuterol Sulfate 108 (90 Base) MCG/ACT AEPB Inhale 108 mcg into the lungs every 6 (six) hours as needed for wheezing.    Marland Kitchen amLODipine (NORVASC) 5 MG tablet Take 1 tablet (5 mg total) by mouth daily. 90 tablet 1  . Cholecalciferol (VITAMIN D) 2000 units  CAPS Take 1 capsule by mouth daily.    . fish oil-omega-3 fatty acids 1000 MG capsule Take 2 g by mouth daily.    . fluticasone (FLONASE) 50 MCG/ACT nasal spray Place 1-2 sprays into both nostrils daily. 16 g 3  . Fluticasone-Salmeterol (ADVAIR DISKUS) 250-50 MCG/DOSE AEPB Inhale 1 puff into the lungs 2 (two) times daily. As needed for cough/bronchitis 1 each 1  . glucose blood (FREESTYLE TEST STRIPS) test strip Use as instructed 100 each 99  . Lancets (FREESTYLE) lancets Use as instructed 100 each 99  . losartan-hydrochlorothiazide (HYZAAR) 100-25 MG tablet Take 1 tablet by mouth daily. 90 tablet 3  . metFORMIN (GLUCOPHAGE) 1000 MG tablet Take 0.5 tablets (500 mg total) by mouth daily with breakfast. 90 tablet 1  . naproxen (NAPROSYN) 500 MG tablet Take 1 tablet (500 mg total) by mouth 2 (two) times daily with a meal. 60 tablet 3  . omeprazole (PRILOSEC) 40 MG capsule Take 1 capsule (40 mg total) by mouth daily. 90 capsule 1  . Potassium 99 MG TABS Take 1 tablet (99 mg total) by mouth daily. 90 tablet 1   No current facility-administered medications on file prior to visit.    Allergies  Allergen Reactions  . Food     soy  . Soybean-Containing Drug Products       Review of Systems:  Constitutional: Feeling well today  HEENT: No  headache, no vision change  Cardiac: No  chest pain, No  pressure, No palpitations  Respiratory:  No  shortness of breath. No  Cough  Gastrointestinal: No  abdominal pain  Musculoskeletal: No new myalgia/arthralgia  Skin: No  Rash  Endocrine: No polyuria/polydipsia    Exam:  BP (!) 157/84   Pulse 75   Ht 5\' 4"  (1.626 m)   Wt 186 lb (84.4 kg)   BMI 31.93 kg/m   Constitutional: VS see above. General Appearance: alert, well-developed, well-nourished, NAD  Eyes: Normal lids and conjunctive, non-icteric sclera  Ears, Nose, Mouth, Throat: MMM, Normal external inspection ears/nares/mouth/lips/gums.  Neck: No masses, trachea midline.    Respiratory: Normal respiratory effort. no wheeze, no rhonchi, no rales  Cardiovascular: S1/S2 normal, no murmur, no rub/gallop auscultated. RRR.   Musculoskeletal: Gait normal. Symmetric and independent movement of all extremities  Neurological: Normal balance/coordination. No tremor.  Skin: warm, dry, intact.   Psychiatric: Normal judgment/insight. Normal mood and affect. Oriented x3.   Results for orders placed or performed in visit on 10/26/16 (from the past 24 hour(s))  POCT HgB A1C     Status: None   Collection Time: 10/26/16  8:57 AM  Result Value Ref Range   Hemoglobin A1C 6.9       ASSESSMENT/PLAN:   Type 2 diabetes mellitus without complication, without long-term current use of insulin (HCC) - Plan: POCT HgB A1C, Lipid panel  Benign essential HTN  Hyperlipidemia associated with type 2 diabetes mellitus (HCC)  Chronic left shoulder pain - Plan: naproxen (NAPROSYN) 500 MG tablet    Patient Instructions  Based on current guidelines and your personal risk factors, we should consider starting statin medication for cholesterol/heart attack and stroke prevention. We will recheck cholesterol today. We'll talk about this more when I have your cholesterol numbers back.  Would recommend restarting the metformin since her A1c increased a little bit. Keep an eye on your fasting morning blood sugars. These should be greater than 70. P show your blood sugars are dropping or you're having problems with metformin, we can consider switching medications, or trying an extended release version of the metformin.  You should also start taking an aspirin 81 mg per day.  Left plan to follow-up again in 3 months to recheck A1c/diabetes control.     Follow-up plan: Return in about 3 months (around 01/26/2017) for recheck DM2, ANNUAL PHYSICAL / PAP.  Visit summary with medication list and pertinent instructions was printed for patient to review, alert [place] if any changes needed. All  questions at time of visit were answered - patient instructed to contact office with any additional concerns. ER/RTC precautions were reviewed with the patient and understanding verbalized.

## 2016-10-26 NOTE — Patient Instructions (Signed)
Based on current guidelines and your personal risk factors, we should consider starting statin medication for cholesterol/heart attack and stroke prevention. We will recheck cholesterol today. We'll talk about this more when I have your cholesterol numbers back.  Would recommend restarting the metformin since her A1c increased a little bit. Keep an eye on your fasting morning blood sugars. These should be greater than 70. P show your blood sugars are dropping or you're having problems with metformin, we can consider switching medications, or trying an extended release version of the metformin.  You should also start taking an aspirin 81 mg per day.  Left plan to follow-up again in 3 months to recheck A1c/diabetes control.

## 2016-11-02 ENCOUNTER — Other Ambulatory Visit: Payer: Self-pay | Admitting: Osteopathic Medicine

## 2016-11-02 MED FILL — FREESTYLE LITE TEST STRIP: 50 days supply | Qty: 100 | Fill #0

## 2016-11-02 MED FILL — FREESTYLE LANCETS: 30 days supply | Qty: 100 | Fill #0

## 2016-11-14 MED FILL — FLUTICASONE PROP 50 MCG SPR: 50 | 30 days supply | Qty: 16 | Fill #3

## 2016-11-14 MED FILL — FREESTYLE LITE METER: 1 days supply | Qty: 1 | Fill #0

## 2016-11-15 ENCOUNTER — Other Ambulatory Visit: Payer: Self-pay

## 2016-11-15 MED ORDER — POTASSIUM 99 MG PO TABS: 1.0000 | ORAL_TABLET | Freq: Every day | ORAL | 1 refills | Status: DC

## 2016-11-15 NOTE — Telephone Encounter (Signed)
Patient request refill for Potassium 10 MEQ. Leah Nash,CMA

## 2016-11-17 ENCOUNTER — Telehealth: Payer: Self-pay

## 2016-11-17 DIAGNOSIS — E119 Type 2 diabetes mellitus without complications: Secondary | ICD-10-CM

## 2016-11-17 NOTE — Telephone Encounter (Signed)
Pt states that you directed her to cut 1000 MG metformin tablets in half. She states that taking 500 MG of metformin is too strong and would like to take 250 MG instead. Please advise.

## 2016-11-20 MED ORDER — METFORMIN HCL 500 MG PO TABS: 250.0000 mg | ORAL_TABLET | Freq: Every day | ORAL | 1 refills | Status: DC

## 2016-11-20 NOTE — Telephone Encounter (Signed)
Fine, I sent in 500 mg tablets which she should be able to cut in half. Our goal would be to get to a higher dose as I'm not sure that 250 mg daily will make much of a difference in her blood sugar, we can talk about this at her follow-up visit depending on how the A1c is looking

## 2016-11-20 NOTE — Telephone Encounter (Signed)
Spoke to patient gave her advise as noted below. Harlem Thresher,CMA  

## 2016-11-28 MED FILL — metFORMIN HCL 500 MG TABS: 500 | 60 days supply | Qty: 30 | Fill #0

## 2016-12-13 ENCOUNTER — Other Ambulatory Visit: Payer: Self-pay | Admitting: *Deleted

## 2016-12-13 NOTE — Patient Outreach (Signed)
Secure e-mail sent to [facility] address requesting she contact this RNCM to arrange for Link To Wellness follow up since [facility] onboarding has been temporarily suspended. Await response from [name]. Barrington Ellison RN,CCM,CDE [place] Management Coordinator Link To Wellness Office Phone [phone] Office Fax [phone]

## 2016-12-28 MED FILL — LOSARTAN-HCTZ 100-25 MG TAB: 100-25 | 90 days supply | Qty: 90 | Fill #2

## 2016-12-28 MED FILL — OMEPRAZOLE DR 40 MG CAPSULE: 40 | 90 days supply | Qty: 90 | Fill #1

## 2016-12-28 MED FILL — FLUTICASONE PROP 50 MCG SPR: 50 | 30 days supply | Qty: 16 | Fill #0

## 2017-01-03 ENCOUNTER — Ambulatory Visit: Payer: Self-pay | Admitting: *Deleted

## 2017-01-17 ENCOUNTER — Ambulatory Visit: Payer: Self-pay | Admitting: *Deleted

## 2017-01-17 ENCOUNTER — Other Ambulatory Visit: Payer: Self-pay | Admitting: *Deleted

## 2017-01-17 NOTE — Patient Outreach (Signed)
Leah Nash did not keep her 1:30 pm routine link To Wellness appointment of today. When reached by phone, she said she called the [facility] CM office on Monday 01/15/17 and cancelled the appointment and rescheduled it for 01/30/17. According to [facility] there was no appointment on this RNCM's on 6/5. According to the [place] assistant [facility] who spoke with Leah Nash;  Leah Nash told her she would call back with a date to reschedule but she did not so the appointment of today was never cancelled. Leah Nash states she will call with available dates when her new work schedule comes out next week. Barrington Ellison RN,CCM,CDE [place] Management Coordinator Link To Wellness Office Phone [phone] Office Fax [phone]

## 2017-01-26 ENCOUNTER — Encounter: Payer: 59 | Admitting: Osteopathic Medicine

## 2017-01-26 ENCOUNTER — Telehealth: Payer: Self-pay

## 2017-01-26 MED ORDER — BENZONATATE 200 MG PO CAPS: 200.0000 mg | ORAL_CAPSULE | Freq: Three times a day (TID) | ORAL | 0 refills | Status: DC | PRN

## 2017-01-26 MED FILL — BENZONATATE 200 MG CAP: 200 | 10 days supply | Qty: 30 | Fill #0

## 2017-01-26 NOTE — Telephone Encounter (Signed)
Patient called stated that she has been coughing since last week and she is requesting a cough medicine sent to her pharmacy. Please advise as patient do have an appointment already scheduled for next week. Rhonda Cunningham,CMA

## 2017-01-26 NOTE — Telephone Encounter (Signed)
Cough medicine sent to pharmacy on file, can call patient and let her know

## 2017-01-26 NOTE — Telephone Encounter (Signed)
Patient has been informed. Leah Nash,CMA  

## 2017-01-30 ENCOUNTER — Ambulatory Visit
Admission: RE | Admit: 2017-01-30 | Discharge: 2017-01-30 | Disposition: A | Discharge status: Home / Self Care | Payer: 59 | Source: Ambulatory Visit | Attending: Osteopathic Medicine | Admitting: Osteopathic Medicine

## 2017-01-30 ENCOUNTER — Ambulatory Visit (INDEPENDENT_AMBULATORY_CARE_PROVIDER_SITE_OTHER): Payer: 59 | Admitting: Osteopathic Medicine

## 2017-01-30 ENCOUNTER — Encounter: Payer: Self-pay | Admitting: Osteopathic Medicine

## 2017-01-30 VITALS — BP 135/81 | HR 92 | Ht 64.0 in | Wt 192.0 lb

## 2017-01-30 DIAGNOSIS — K219 Gastro-esophageal reflux disease without esophagitis: Secondary | ICD-10-CM | POA: Diagnosis not present

## 2017-01-30 DIAGNOSIS — J3089 Other allergic rhinitis: Secondary | ICD-10-CM

## 2017-01-30 DIAGNOSIS — R928 Other abnormal and inconclusive findings on diagnostic imaging of breast: Secondary | ICD-10-CM | POA: Diagnosis not present

## 2017-01-30 DIAGNOSIS — Z0000 Encounter for general adult medical examination without abnormal findings: Secondary | ICD-10-CM | POA: Diagnosis not present

## 2017-01-30 DIAGNOSIS — I10 Essential (primary) hypertension: Secondary | ICD-10-CM | POA: Diagnosis not present

## 2017-01-30 DIAGNOSIS — E119 Type 2 diabetes mellitus without complications: Secondary | ICD-10-CM

## 2017-01-30 DIAGNOSIS — J309 Allergic rhinitis, unspecified: Secondary | ICD-10-CM | POA: Insufficient documentation

## 2017-01-30 DIAGNOSIS — N6321 Unspecified lump in the left breast, upper outer quadrant: Secondary | ICD-10-CM | POA: Diagnosis not present

## 2017-01-30 LAB — POCT GLYCOSYLATED HEMOGLOBIN (HGB A1C): Hemoglobin A1C: 6.6

## 2017-01-30 MED ORDER — FLUTICASONE PROPIONATE 50 MCG/ACT NA SUSP: 1.0000 | Freq: Every day | NASAL | 11 refills | Status: DC

## 2017-01-30 MED ORDER — OMEPRAZOLE 40 MG PO CPDR: 40.0000 mg | DELAYED_RELEASE_CAPSULE | Freq: Every day | ORAL | 3 refills | Status: DC

## 2017-01-30 MED ORDER — METFORMIN HCL 500 MG PO TABS: 500.0000 mg | ORAL_TABLET | Freq: Every day | ORAL | 3 refills | Status: DC

## 2017-01-30 NOTE — Patient Instructions (Signed)
Need to see when due for next colonoscopy - will get records, or you can call as below:  [facility]  [address] [place], [place] [address]  [phone]

## 2017-01-30 NOTE — Progress Notes (Signed)
HPI: Leah Nash is a 61 y.o. female  who presents to [facility] today, 01/30/17,  for chief complaint of:  Chief Complaint  Patient presents with  . Annual Exam      Patient here for annual physical / wellness exam.  See preventive care reviewed as below.  Recent labs reviewed in detail with the patient.   Additional concerns today include:   DM2: recently  Back on metformin, taking 500 mg daily, doing well. Improvement in A1c today.  HLD: recommended starting statin, patient would still like to hold off on this.  HTN: pt had been taking BP meds inconsistently - BP looks good today.   Past medical, surgical, social and family history reviewed: Patient Active Problem List   Diagnosis Date Noted  . Atypical squamous cells of undetermined significance on cytologic smear of cervix (ASC-US) 10/03/2016  . Failure to attend appointment 05/18/2016  . Colon, diverticulosis 05/20/2014  . Class 1 obesity 03/04/2013  . Plantar fasciitis, bilateral 02/14/2013  . Type II diabetes mellitus ([place]) 12/29/2011  . Fibroid 01/27/2010  . Benign essential HTN 07/20/2009  . Asthma, mild intermittent 12/23/2008  . External hemorrhoid 11/26/2007   Past Surgical History:  Procedure Laterality Date  . none     Social History  Substance Use Topics  . Smoking status: Never Smoker  . Smokeless tobacco: Never Used  . Alcohol use No   Family History  Problem Relation Age of Onset  . Cancer Mother   . Breast cancer Mother   . Alcohol abuse Father   . Depression Daughter   . Diabetes Maternal Aunt   . Breast cancer Paternal Aunt      Current medication list and allergy/intolerance information reviewed:   Current Outpatient Prescriptions  Medication Sig Dispense Refill  . Albuterol Sulfate 108 (90 Base) MCG/ACT AEPB Inhale 108 mcg into the lungs every 6 (six) hours as needed for wheezing.    Marland Kitchen amLODipine (NORVASC) 5 MG tablet Take 1 tablet (5 mg  total) by mouth daily. 90 tablet 1  . benzonatate (TESSALON) 200 MG capsule Take 1 capsule (200 mg total) by mouth 3 (three) times daily as needed for cough. 30 capsule 0  . Cholecalciferol (VITAMIN D) 2000 units CAPS Take 1 capsule by mouth daily.    . fish oil-omega-3 fatty acids 1000 MG capsule Take 2 g by mouth daily.    . fluticasone (FLONASE) 50 MCG/ACT nasal spray Place 1-2 sprays into both nostrils daily. 16 g 3  . Fluticasone-Salmeterol (ADVAIR DISKUS) 250-50 MCG/DOSE AEPB Inhale 1 puff into the lungs 2 (two) times daily. As needed for cough/bronchitis 1 each 1  . FREESTYLE LITE test strip USE AS INSTRUCTED 100 each PRN  . Lancets (FREESTYLE) lancets Use as instructed 100 each 99  . losartan-hydrochlorothiazide (HYZAAR) 100-25 MG tablet Take 1 tablet by mouth daily. 90 tablet 3  . metFORMIN (GLUCOPHAGE) 500 MG tablet Take 0.5 tablets (250 mg total) by mouth daily with breakfast. 30 tablet 1  . naproxen (NAPROSYN) 500 MG tablet Take 1 tablet (500 mg total) by mouth 2 (two) times daily with a meal. 60 tablet 3  . omeprazole (PRILOSEC) 40 MG capsule Take 1 capsule (40 mg total) by mouth daily. 90 capsule 1  . Potassium 99 MG TABS Take 1 tablet (99 mg total) by mouth daily. 90 tablet 1   No current facility-administered medications for this visit.    Allergies  Allergen Reactions  . Food  soy  . Soybean-Containing Drug Products       Review of Systems:  Constitutional:  No  fever, no chills, No recent illness.   HEENT: No  headache, no vision change  Cardiac: No  chest pain, No  pressure, No palpitations  Respiratory:  No  shortness of breath. No  Cough  Gastrointestinal: No  abdominal pain, No  nausea, No  vomiting,  No  blood in stool, No  diarrhea, No  constipation   Musculoskeletal: No new myalgia/arthralgia  Skin: No  Rash  Endocrine: No cold intolerance,  No heat intolerance. No polyuria/polydipsia/polyphagia   Neurologic: No  weakness, No   dizziness  Psychiatric: No  concerns with depression, No  concerns with anxiety   Exam:  BP 135/81   Pulse 92   Ht 5\' 4"  (1.626 m)   Wt 192 lb (87.1 kg)   BMI 32.96 kg/m   Constitutional: VS see above. General Appearance: alert, well-developed, well-nourished, NAD  Eyes: Normal lids and conjunctive, non-icteric sclera  Ears, Nose, Mouth, Throat: MMM, Normal external inspection ears/nares/mouth/lips/gums.  Neck: No masses, trachea midline. No thyroid enlargement. No tenderness/mass appreciated. No lymphadenopathy  Respiratory: Normal respiratory effort. no wheeze, no rhonchi, no rales  Cardiovascular: S1/S2 normal, no murmur, no rub/gallop auscultated. RRR. No lower extremity edema.   Gastrointestinal: Nontender, no masses. No hepatomegaly, no splenomegaly. No hernia appreciated. Bowel sounds normal. Rectal exam deferred.   Musculoskeletal: Gait normal. No clubbing/cyanosis of digits.   Neurological: Normal balance/coordination. No tremor.   Skin: warm, dry, intact. No rash/ulcer.  Psychiatric: Normal judgment/insight. Normal mood and affect. Oriented x3.     ASSESSMENT/PLAN:   Annual physical exam - See below for review of preventive care, refilled medications, brief monitoring of chronic medical conditions today  Controlled type 2 diabetes mellitus without complication, without long-term current use of insulin ([place]) - Plan: POCT HgB A1C, metFORMIN (GLUCOPHAGE) 500 MG tablet  Gastroesophageal reflux disease, esophagitis presence not specified - Plan: omeprazole (PRILOSEC) 40 MG capsule  Benign essential HTN  Seasonal allergic rhinitis due to other allergic trigger - Plan: fluticasone (FLONASE) 50 MCG/ACT nasal spray    FEMALE PREVENTIVE CARE Updated 01/30/17   ANNUAL SCREENING/COUNSELING  Diet/Exercise - HEALTHY HABITS DISCUSSED TO DECREASE CV RISK History  Smoking Status  . Never Smoker  Smokeless Tobacco  . Never Used   History  Alcohol Use No  none    Depression screen PHQ 2/9 01/20/2016  Decreased Interest 3  Down, Depressed, Hopeless 0  PHQ - 2 Score 3  Altered sleeping 1  Tired, decreased energy 1  Change in appetite 1  Feeling bad or failure about yourself  0  Trouble concentrating 0  Moving slowly or fidgety/restless 0  Suicidal thoughts 0  PHQ-9 Score 6  Difficult doing work/chores Not difficult at all     Domestic violence concerns - no  HTN SCREENING - SEE VITALS  SEXUAL HEALTH  Need/want STI testing today? - no  Concerns about libido or pain with sex? - no  Plans for pregnancy? - n/a  INFECTIOUS DISEASE SCREENING  HIV - does not need - previously negative  GC/CT - does not need  HepC - DOB 1944-1965 - does not need - previously negative  TB - does not need  DISEASE SCREENING  Lipid - does not need  DM2 - does not need  Osteoporosis - women age 69+ - does not need  CANCER SCREENING  Cervical - does not need - will be due 04/2018 (ASCUS  but Neg HPV)  Breast - does not need  Lung - does not need  Colon - does not need - need records   ADULT VACCINATION  Influenza - annual vaccine recommended  Td - booster every 10 years   Zoster - option at 28, yes at 60+   PCV13 - was not indicated  PPSV23 - was not indicated Immunization History  Administered Date(s) Administered  . Influenza-Unspecified 05/06/2015, 05/28/2016  . Pneumococcal Polysaccharide-23 03/01/2012  . Tdap 12/14/2005, 06/12/2016    Patient Instructions  Need to see when due for next colonoscopy - will get records, or you can call as below:  [facility]  [address]  [place], [place] [address]  [phone]      Visit summary with medication list and pertinent instructions was printed for patient to review. All questions at time of visit were answered - patient instructed to contact office with any additional concerns. ER/RTC precautions were reviewed with the patient. Follow-up plan: Return  in about 6 months (around 08/01/2017) for RECHECK BLOOD PRESSURE AND DIABETES - sooner if needed.

## 2017-02-16 ENCOUNTER — Encounter: Payer: Self-pay | Admitting: Osteopathic Medicine

## 2017-02-22 ENCOUNTER — Encounter: Payer: Self-pay | Admitting: *Deleted

## 2017-02-22 ENCOUNTER — Other Ambulatory Visit: Payer: Self-pay | Admitting: *Deleted

## 2017-02-22 ENCOUNTER — Ambulatory Visit: Payer: Self-pay | Admitting: *Deleted

## 2017-02-22 VITALS — BP 120/84 | Ht 65.0 in | Wt 189.8 lb

## 2017-02-22 DIAGNOSIS — E119 Type 2 diabetes mellitus without complications: Secondary | ICD-10-CM

## 2017-02-22 LAB — POCT CBG (FASTING - GLUCOSE)-MANUAL ENTRY: Glucose Fasting, POC: 123 mg/dL — AB (ref 70–99)

## 2017-02-22 NOTE — Patient Outreach (Signed)
[facility]) Care Management   02/22/2017  Leah Nash November 01, 1955 [id number]  Leah Nash is an 61 y.o. female who presents to the [place] Management office for routine Link To Wellness follow up for self management assistance with Type II DM, HTN, hyperlipidemia and obesity.  Subjective:  "Leah Nash" says she just returned form vacation in [place] and has been eating more CHOs and sweets. Despite this she says she wants to stop Metformin and control her diabetes through lifestyle changes.  She says she is not participating in a formal exercise program but she does  walk a great distance when she is in class at [employer]. She says she purposely parks far way and carries a heavy backpack on her back. She says once she is on break from school . She also [facility] [employer] and [facility] will [facility] her [facility] aerobics there.  She says she saw her primary care provider on 01/30/17 but does not know the results of her blood work other than she reports her Hgb A1C= 6.4%. (It was actually 6.6%)  She says she continues to check her CBG 1-2 times daily and is requesting a POC CBG today. 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  She wants to participate in the [facility] platform and she meets the eligibility requirements.   Objective:   Review of Systems  Constitutional: Negative.     Physical Exam  Constitutional: She is oriented to person, place, and time. She appears well-developed and well-nourished.  Respiratory: Effort normal.  Neurological: She is alert and oriented to person, place, and time.  Skin: Skin is warm and dry.  Psychiatric: She has a normal mood and affect. Her behavior is normal. Judgment and thought content normal.   Vitals:   02/22/17 1151  BP: 120/84   Filed Weights   02/22/17 1151  Weight: 189 lb 12.8  oz (86.1 kg)   Random POC CBG= 123  Encounter Medications:   Outpatient Encounter Prescriptions as of 01/20/2016  Medication Sig Note  . amLODipine (NORVASC) 5 MG tablet Take 5 mg by mouth daily.   . fish oil-omega-3 fatty acids 1000 MG capsule Take 2 g by mouth daily.   Marland Kitchen omeprazole (PRILOSEC) 40 MG capsule Take 40 mg by mouth daily. 01/20/2016: Takes prn  . triamterene-hydrochlorothiazide (DYAZIDE) 37.5-25 MG per capsule Take 1 capsule by mouth every morning.   . [DISCONTINUED] potassium chloride (K-DUR,KLOR-CON) 10 MEQ tablet Take 10 mEq by mouth 2 (two) times daily.   . naproxen (NAPROSYN) 500 MG tablet Take 1 tablet (500 mg total) by mouth 2 (two) times daily with a meal. (Patient not taking: Reported on 01/20/2016)    Metformin  500 mg twice daily   . Potassium 75 MG TABS Take by mouth. 01/20/2016: Received from: [facility]   No facility-administered encounter medications on file as of 01/20/2016.    Functional Status:   In your present state of health, do you have any difficulty performing the following activities: 06/26/2016  Hearing? N  Vision? N  Difficulty concentrating or making decisions? N  Walking or climbing stairs? N  Dressing or bathing? N  Doing errands, shopping? N  Preparing Food and eating ? N  Using the Toilet? N  In the past six months, have you accidently leaked urine? N  Do you have  problems with loss of bowel control? N  Managing your Medications? N  Managing your Finances? N  Housekeeping or managing your Housekeeping? N  Some recent data might be hidden    Fall/Depression Screening:    PHQ 2/9 Scores 01/20/2016 12/07/2015  PHQ - 2 Score 3 0  PHQ- 9 Score 6 -    Assessment:   [employer] employee with Type II DM, HTN, hyperlipidemia ( on no medications) and obesity currently in the Link To Wellness program transitioning to the [employer]   Plan:  [facility] CM Care Plan Problem One        Most Recent Value   Care Plan  Problem One  Type II DM, HTN , hyperlipidemia and obesity meeting treatment targets for DM and HTN   Role Documenting the Problem One  Care Management Coordinator   Care Plan for Problem One  Active   THN Long Term Goal (31-90 days)  Improved glycemic control as evidenced by Hgb A1C of <6.5%, ongoing good control of HTN as evidenced by average BP readings of <140/<90, improved hyperlipidemia as evidenced by normal lipid profile at next assessment, and ongoing weight loss or no weight gain at next assessment   THN Long Term Goal Start Date  02/22/17   Interventions for Problem One Long Term Goal   Reviewed Hgb A1C result done 01/30/17 and reviewed target, discussed longevity,  safety , mechanism of action and benefits of Metformin and recommendation of the American Diabetes Association that all patients with type II diabetes remain on Metformin unless intolerant to it, reviewed patient's medications and assessed medication adherence, reinforced importance of taking all medications as prescribed, reviewed meal plan, reviewed effects of physical activity on glucose levels and long-term glucose control by improving insulin sensitivity and assisting with weight management and cardiovascular health, encouraged patient to continue to exercise, reviewed importance of blood glucose monitoring and interpretation in assisting with DM management, assessed POC CBG and reviewed results, reviewed recommended target ranges for pre-meal and post-meal, reviewed [employer] and arranged for Leah Nash to onboard into the [place] program on 02/23/17 at the [place] office     RNCM to fax today's office visit note to patient's primary care provider.   Barrington Ellison RN,CCM,CDE [place] Management Coordinator Link To Wellness Office Phone [phone] Office Fax [phone]

## 2017-03-09 MED FILL — FLUTICASONE PROP 50 MCG SPR: 50 | 30 days supply | Qty: 16 | Fill #0

## 2017-03-09 MED FILL — metFORMIN HCL 500 MG TABS: 500 | 90 days supply | Qty: 90 | Fill #0

## 2017-04-04 ENCOUNTER — Other Ambulatory Visit: Payer: Self-pay | Admitting: *Deleted

## 2017-04-04 DIAGNOSIS — E119 Type 2 diabetes mellitus without complications: Secondary | ICD-10-CM

## 2017-04-04 LAB — POCT GLYCOSYLATED HEMOGLOBIN (HGB A1C): Hemoglobin A1C: 6.6

## 2017-04-04 LAB — POCT CBG (FASTING - GLUCOSE)-MANUAL ENTRY: Glucose Fasting, POC: 127 mg/dL — AB (ref 70–99)

## 2017-04-04 NOTE — Patient Outreach (Signed)
Leah Nash presents to the [place] office with her granddaughter Leah Nash for [employer] onboarding and for POC Hgb A1C check. Briefly demonstrated the Freestyle libre flash glucose monitoring system but Leah Nash is on 500 mg of Metformin daily only and checks her blood sugar once daily so she does not feel she needs a continuous glucose monitor.  States she checked her blood sugar this morning after drinking a "green Juice" and was very upset that her blood sugar was 170. Since she had the green juice bottle with her we reviewed label reading including CHO and sugar amounts int he juice which was very high for both. AT her request, a POC CBG was checked and she was instructed how to enter the blood sugar manually into [facility].  After checking her POC CBG we discussed the results , the target and the estimated average glucose for her A1C value. Will provide ongoing DM self management assistance via [place]. Barrington Ellison RN,CCM,CDE [place] Management Coordinator Link To Wellness and [employer] [phone] Office Fax [phone]

## 2017-04-05 ENCOUNTER — Other Ambulatory Visit: Payer: Self-pay | Admitting: Osteopathic Medicine

## 2017-04-05 MED FILL — LOSARTAN-HCTZ 100-25 MG TAB: 100-25 | 90 days supply | Qty: 90 | Fill #3

## 2017-04-06 ENCOUNTER — Telehealth: Payer: Self-pay

## 2017-04-06 MED ORDER — BENZONATATE 200 MG PO CAPS: 200.0000 mg | ORAL_CAPSULE | Freq: Three times a day (TID) | ORAL | 0 refills | Status: DC | PRN

## 2017-04-06 NOTE — Telephone Encounter (Signed)
Patient called stated that she is coughing she has bronchitis and Dr. Sheppard Coil usually gives her Leah Nash, she was advised that Dr. Sheppard Coil is out of the office. Please advise. Leah Nash,CMA

## 2017-04-06 NOTE — Telephone Encounter (Signed)
Patient has been informed that medication  Has been sent to her pharmacy. Shayan Bramhall,CMA

## 2017-04-06 NOTE — Telephone Encounter (Signed)
Okay to refill her [employer] But if she is not getting better or if she feels like it's her asthma then recommend that she make an appointment.

## 2017-05-01 MED FILL — BENZONATATE 200 MG CAP: 200 | 10 days supply | Qty: 30 | Fill #0

## 2017-06-07 MED FILL — ACCU-CHEK GUIDE TEST STRIP: 50 days supply | Qty: 100 | Fill #0

## 2017-06-07 MED FILL — OMEPRAZOLE DR 40 MG CAPSULE: 40 | 90 days supply | Qty: 90 | Fill #0

## 2017-06-07 MED FILL — FLUTICASONE PROP 50 MCG SPR: 50 | 30 days supply | Qty: 16 | Fill #1

## 2017-06-07 MED FILL — NAPROXEN 500 MG TABLET: 500 | 30 days supply | Qty: 60 | Fill #1

## 2017-06-07 MED FILL — metFORMIN HCL 500 MG TABS: 500 | 90 days supply | Qty: 90 | Fill #1

## 2017-07-09 ENCOUNTER — Other Ambulatory Visit: Payer: Self-pay | Admitting: Osteopathic Medicine

## 2017-07-09 DIAGNOSIS — I10 Essential (primary) hypertension: Secondary | ICD-10-CM

## 2017-07-10 MED FILL — LOSARTAN-HCTZ 100-25 MG TAB: 100-25 | 90 days supply | Qty: 90 | Fill #0

## 2017-07-31 ENCOUNTER — Other Ambulatory Visit: Payer: Self-pay | Admitting: *Deleted

## 2017-07-31 ENCOUNTER — Ambulatory Visit: Payer: 59 | Admitting: Osteopathic Medicine

## 2017-07-31 NOTE — Patient Outreach (Signed)
[name] transitioned from the [employer] To Wellness program to the [employer] on 04/04/17 for Type II diabetes self-management assistance so will close case to the diabetes Link To Wellness program due to delegation of disease management services to [employer] from [employer] for [place] members in 2019. Barrington Ellison RN,CCM,CDE [place] Management Coordinator Link To Wellness and [employer] [phone] Office Fax [phone]

## 2017-08-29 DIAGNOSIS — H527 Unspecified disorder of refraction: Secondary | ICD-10-CM | POA: Diagnosis not present

## 2017-08-29 DIAGNOSIS — E119 Type 2 diabetes mellitus without complications: Secondary | ICD-10-CM | POA: Diagnosis not present

## 2017-08-29 DIAGNOSIS — Z7984 Long term (current) use of oral hypoglycemic drugs: Secondary | ICD-10-CM | POA: Diagnosis not present

## 2017-08-31 MED FILL — NAPROXEN 500 MG TABLET: 500 | 30 days supply | Qty: 60 | Fill #2

## 2017-09-05 MED FILL — metFORMIN HCL 500 MG TABS: 500 | 90 days supply | Qty: 90 | Fill #2

## 2017-09-05 MED FILL — FLUTICASONE PROP 50 MCG SPR: 50 | 30 days supply | Qty: 16 | Fill #2

## 2017-10-15 ENCOUNTER — Other Ambulatory Visit: Payer: Self-pay | Admitting: Osteopathic Medicine

## 2017-10-15 MED FILL — LOSARTAN-HCTZ 100-25 MG TAB: 100-25 | 90 days supply | Qty: 90 | Fill #1

## 2017-10-15 MED FILL — ACCU-CHEK GUIDE TEST STRIP: 90 days supply | Qty: 100 | Fill #0

## 2017-10-15 MED FILL — OMEPRAZOLE DR 40 MG CAPSULE: 40 | 90 days supply | Qty: 90 | Fill #1

## 2017-11-01 ENCOUNTER — Ambulatory Visit: Payer: 59 | Admitting: Osteopathic Medicine

## 2017-11-02 ENCOUNTER — Encounter: Payer: Self-pay | Admitting: Osteopathic Medicine

## 2017-11-02 ENCOUNTER — Ambulatory Visit (INDEPENDENT_AMBULATORY_CARE_PROVIDER_SITE_OTHER): Payer: 59 | Admitting: Osteopathic Medicine

## 2017-11-02 VITALS — BP 145/85 | HR 86 | Temp 98.1°F | Wt 196.1 lb

## 2017-11-02 DIAGNOSIS — I10 Essential (primary) hypertension: Secondary | ICD-10-CM | POA: Diagnosis not present

## 2017-11-02 DIAGNOSIS — E119 Type 2 diabetes mellitus without complications: Secondary | ICD-10-CM

## 2017-11-02 LAB — POCT GLYCOSYLATED HEMOGLOBIN (HGB A1C): Hemoglobin A1C: 6.3

## 2017-11-02 NOTE — Progress Notes (Signed)
HPI: Leah Nash is a 62 y.o. female who  has a past medical history of Diabetes mellitus without complication ([place]), GERD (gastroesophageal reflux disease), and Hypertension.  she presents to [facility] today, 11/02/17,  for chief complaint of:  DM2 and HTN follow-up   Following with [facility] program for Dm2 - recently sent records of her home blood sugars. Looks like fasting blood sugars are typically in the 110s to 120s. Occasionally up into the 130s, once as high as 145. A1C indicates good overall control as below.   Patient states she could be a bit better about diet.  HTN: no CP/SOB, no HA/VC. Not consistent about taking blood pressure medications daily at the same time.   Results for orders placed or performed in visit on 11/02/17 (from the past 24 hour(s))  POCT HgB A1C     Status: None   Collection Time: 11/02/17 11:18 AM  Result Value Ref Range   Hemoglobin A1C 6.3        Past medical history, surgical history, social history and family history reviewed. No updates needed.   Current medication list and allergy/intolerance information reviewed.    Current Outpatient Medications on File Prior to Visit  Medication Sig Dispense Refill  . Albuterol Sulfate 108 (90 Base) MCG/ACT AEPB Inhale 108 mcg into the lungs every 6 (six) hours as needed for wheezing.    . benzonatate (TESSALON) 200 MG capsule Take 1 capsule (200 mg total) by mouth 3 (three) times daily as needed for cough. 30 capsule 0  . Cholecalciferol (VITAMIN D) 2000 units CAPS Take 1 capsule by mouth daily.    . fish oil-omega-3 fatty acids 1000 MG capsule Take 2 g by mouth daily.    . fluticasone (FLONASE) 50 MCG/ACT nasal spray Place 1-2 sprays into both nostrils daily. 16 g 11  . Fluticasone-Salmeterol (ADVAIR DISKUS) 250-50 MCG/DOSE AEPB Inhale 1 puff into the lungs 2 (two) times daily. As needed for cough/bronchitis 1 each 1  . glucose blood (ACCU-CHEK GUIDE)  test strip Pt needs f/u appt w/PCP for refills 100 each PRN  . Lancets (FREESTYLE) lancets Use as instructed 100 each 99  . losartan-hydrochlorothiazide (HYZAAR) 100-25 MG tablet TAKE 1 TABLET BY MOUTH ONCE DAILY 90 tablet 3  . metFORMIN (GLUCOPHAGE) 500 MG tablet Take 1 tablet (500 mg total) by mouth daily with breakfast. 90 tablet 3  . naproxen (NAPROSYN) 500 MG tablet Take 1 tablet (500 mg total) by mouth 2 (two) times daily with a meal. 60 tablet 3  . omeprazole (PRILOSEC) 40 MG capsule Take 1 capsule (40 mg total) by mouth daily. 90 capsule 3  . Potassium 99 MG TABS Take 1 tablet (99 mg total) by mouth daily. 90 tablet 1   No current facility-administered medications on file prior to visit.    Allergies  Allergen Reactions  . Food     soy  . Soybean-Containing Drug Products       Review of Systems:  Constitutional: No recent illness  HEENT: No  headache, no vision change  Cardiac: No  chest pain, No  pressure, No palpitations  Respiratory:  No  shortness of breath. No  Cough  Gastrointestinal: No  abdominal pain, no change on bowel habits  Musculoskeletal: No new myalgia/arthralgia  Skin: No  Rash  Hem/Onc: No  easy bruising/bleeding, No  abnormal lumps/bumps  Neurologic: No  weakness, No  Dizziness  Psychiatric: No  concerns with depression, No  concerns with anxiety  Exam:  BP (!) 145/85   Pulse 86   Temp 98.1 F (36.7 C) (Oral)   Wt 196 lb 1.3 oz (88.9 kg)   BMI 32.63 kg/m  145/85 on recheck   Constitutional: VS see above. General Appearance: alert, well-developed, well-nourished, NAD  Eyes: Normal lids and conjunctive, non-icteric sclera  Ears, Nose, Mouth, Throat: MMM, Normal external inspection ears/nares/mouth/lips/gums.  Neck: No masses, trachea midline.   Respiratory: Normal respiratory effort. no wheeze, no rhonchi, no rales  Cardiovascular: S1/S2 normal, no murmur, no rub/gallop auscultated. RRR.   Musculoskeletal: Gait normal. Symmetric  and independent movement of all extremities  Neurological: Normal balance/coordination. No tremor.  Skin: warm, dry, intact.   Psychiatric: Normal judgment/insight. Normal mood and affect. Oriented x3.     ASSESSMENT/PLAN:   Benign essential HTN - advised med adjustment if not at goal next visit - pt reluctant to change meds at this time. Advised consistent administration of Rx.   Type 2 diabetes mellitus without complication, without long-term current use of insulin ([place]) - Plan: POCT HgB A1C     Follow-up plan: Return in about 3 months (around 02/02/2018) for ANNUAL PHYSICAL and recheck BP and sugars .  Visit summary with medication list and pertinent instructions was printed for patient to review, alert [place] if any changes needed. All questions at time of visit were answered - patient instructed to contact office with any additional concerns. ER/RTC precautions were reviewed with the patient and understanding verbalized.   Note: Total time spent 25 minutes, greater than 50% of the visit was spent face-to-face counseling and coordinating care for the following: The primary encounter diagnosis was Benign essential HTN. A diagnosis of Type 2 diabetes mellitus without complication, without long-term current use of insulin (HCC) was also pertinent to this visit.Marland Kitchen  Please note: voice recognition software was used to produce this document, and typos may escape review. Please contact Dr. Sheppard Coil for any needed clarifications.

## 2017-11-05 ENCOUNTER — Encounter: Payer: Self-pay | Admitting: Osteopathic Medicine

## 2017-12-05 MED FILL — metFORMIN HCL 500 MG TABS: 500 | 90 days supply | Qty: 90 | Fill #3

## 2017-12-05 MED FILL — FLUTICASONE PROP 50 MCG SPR: 50 | 30 days supply | Qty: 16 | Fill #3

## 2018-01-04 MED FILL — ACCU-CHEK GUIDE TEST STRIP: 90 days supply | Qty: 100 | Fill #1

## 2018-01-04 MED FILL — LOSARTAN POTASSIUM-HCTZ 100: 100-25 | 90 days supply | Qty: 90 | Fill #2

## 2018-01-29 MED FILL — FLUTICASONE PROP 50 MCG SPR: 50 | 30 days supply | Qty: 16 | Fill #4

## 2018-01-31 ENCOUNTER — Encounter: Payer: 59 | Admitting: Osteopathic Medicine

## 2018-02-01 ENCOUNTER — Other Ambulatory Visit: Payer: Self-pay | Admitting: Osteopathic Medicine

## 2018-02-01 DIAGNOSIS — G8929 Other chronic pain: Secondary | ICD-10-CM

## 2018-02-01 DIAGNOSIS — K219 Gastro-esophageal reflux disease without esophagitis: Secondary | ICD-10-CM

## 2018-02-01 DIAGNOSIS — M25512 Pain in left shoulder: Secondary | ICD-10-CM

## 2018-02-01 MED FILL — OMEPRAZOLE 40 MG CPDR: 40 | 90 days supply | Qty: 90 | Fill #0

## 2018-02-01 MED FILL — NAPROXEN 500 MG TABLET: 500 | 30 days supply | Qty: 60 | Fill #0

## 2018-02-18 ENCOUNTER — Encounter: Payer: 59 | Admitting: Osteopathic Medicine

## 2018-02-20 ENCOUNTER — Encounter: Payer: 59 | Admitting: Osteopathic Medicine

## 2018-03-05 ENCOUNTER — Other Ambulatory Visit: Payer: Self-pay | Admitting: Osteopathic Medicine

## 2018-03-05 DIAGNOSIS — J3089 Other allergic rhinitis: Secondary | ICD-10-CM

## 2018-03-05 DIAGNOSIS — E119 Type 2 diabetes mellitus without complications: Secondary | ICD-10-CM

## 2018-03-05 MED FILL — FLUTICASONE PROP 50 MCG SPR: 50 | 30 days supply | Qty: 16 | Fill #0

## 2018-03-05 MED FILL — metFORMIN HCL 500 MG TABS: 500 | 30 days supply | Qty: 30 | Fill #0

## 2018-03-29 MED FILL — ACCU-CHEK GUIDE TEST STRIP: 90 days supply | Qty: 100 | Fill #2

## 2018-04-02 ENCOUNTER — Encounter: Payer: 59 | Admitting: Osteopathic Medicine

## 2018-04-03 ENCOUNTER — Ambulatory Visit (INDEPENDENT_AMBULATORY_CARE_PROVIDER_SITE_OTHER): Payer: 59 | Admitting: Osteopathic Medicine

## 2018-04-03 ENCOUNTER — Encounter: Payer: Self-pay | Admitting: Osteopathic Medicine

## 2018-04-03 VITALS — BP 129/82 | HR 88 | Temp 98.4°F | Wt 194.8 lb

## 2018-04-03 DIAGNOSIS — E1169 Type 2 diabetes mellitus with other specified complication: Secondary | ICD-10-CM | POA: Diagnosis not present

## 2018-04-03 DIAGNOSIS — I10 Essential (primary) hypertension: Secondary | ICD-10-CM | POA: Diagnosis not present

## 2018-04-03 DIAGNOSIS — Z0000 Encounter for general adult medical examination without abnormal findings: Secondary | ICD-10-CM | POA: Diagnosis not present

## 2018-04-03 DIAGNOSIS — E119 Type 2 diabetes mellitus without complications: Secondary | ICD-10-CM

## 2018-04-03 DIAGNOSIS — K219 Gastro-esophageal reflux disease without esophagitis: Secondary | ICD-10-CM

## 2018-04-03 DIAGNOSIS — Z1231 Encounter for screening mammogram for malignant neoplasm of breast: Secondary | ICD-10-CM

## 2018-04-03 DIAGNOSIS — E785 Hyperlipidemia, unspecified: Secondary | ICD-10-CM

## 2018-04-03 DIAGNOSIS — Z1239 Encounter for other screening for malignant neoplasm of breast: Secondary | ICD-10-CM

## 2018-04-03 LAB — POCT GLYCOSYLATED HEMOGLOBIN (HGB A1C): Hemoglobin A1C: 6.5 % — AB (ref 4.0–5.6)

## 2018-04-03 MED ORDER — METFORMIN HCL 500 MG PO TABS: 1000.0000 mg | ORAL_TABLET | Freq: Every day | ORAL | 1 refills | Status: DC

## 2018-04-03 MED FILL — FLUTICASONE PROP 50 MCG SPR: 50 | 30 days supply | Qty: 16 | Fill #1

## 2018-04-03 MED FILL — metFORMIN HCL 500 MG TABS: 500 | 90 days supply | Qty: 180 | Fill #0

## 2018-04-03 NOTE — Progress Notes (Signed)
HPI: Leah Nash is a 62 y.o. female who  has a past medical history of Diabetes mellitus without complication ([place]), GERD (gastroesophageal reflux disease), and Hypertension.  she presents to [facility] today, 04/03/18,  for chief complaint of: Annual physical DM2 follow-up Sleep problem  Patient here for annual physical / wellness exam.  See preventive care reviewed as below.  Recent labs reviewed in detail with the patient.   Additional concerns today include:  Recheck DM2 Some sleep problems, getting to sleep difficult some nights HTN elevated on intake, better w/ recheck, she is watcing salt/diet    Results for orders placed or performed in visit on 04/03/18 (from the past 24 hour(s))  POCT HgB A1C     Status: Abnormal   Collection Time: 04/03/18 10:14 AM  Result Value Ref Range   Hemoglobin A1C 6.5 (A) 4.0 - 5.6 %   HbA1c POC (<> result, manual entry)  4.0 - 5.6 %   HbA1c, POC (prediabetic range)  5.7 - 6.4 %   HbA1c, POC (controlled diabetic range)  0.0 - 7.0 %         Past medical, surgical, social and family history reviewed:  Patient Active Problem List   Diagnosis Date Noted  . Allergic rhinitis due to allergen 01/30/2017  . Gastroesophageal reflux disease 01/30/2017  . Atypical squamous cells of undetermined significance on cytologic smear of cervix (ASC-US) 10/03/2016  . Failure to attend appointment 05/18/2016  . Colon, diverticulosis 05/20/2014  . Class 1 obesity 03/04/2013  . Plantar fasciitis, bilateral 02/14/2013  . Controlled type 2 diabetes mellitus without complication, without long-term current use of insulin ([place]) 12/29/2011  . Fibroid 01/27/2010  . Benign essential HTN 07/20/2009  . Asthma, mild intermittent 12/23/2008  . External hemorrhoid 11/26/2007    Past Surgical History:  Procedure Laterality Date  . none      Social History   Tobacco Use  . Smoking status: Never Smoker  . Smokeless  tobacco: Never Used  Substance Use Topics  . Alcohol use: No    Alcohol/week: 0.0 oz    Family History  Problem Relation Age of Onset  . Cancer Mother   . Breast cancer Mother   . Alcohol abuse Father   . Depression Daughter   . Diabetes Maternal Aunt   . Breast cancer Paternal Aunt      Current medication list and allergy/intolerance information reviewed:    Current Outpatient Medications  Medication Sig Dispense Refill  . benzonatate (TESSALON) 200 MG capsule Take 1 capsule (200 mg total) by mouth 3 (three) times daily as needed for cough. 30 capsule 0  . Cholecalciferol (VITAMIN D) 2000 units CAPS Take 1 capsule by mouth daily.    . fish oil-omega-3 fatty acids 1000 MG capsule Take 2 g by mouth daily.    . fluticasone (FLONASE) 50 MCG/ACT nasal spray PLACE 1-2 SPRAYS INTO BOTH NOSTRILS DAILY. 16 g 1  . Fluticasone-Salmeterol (ADVAIR DISKUS) 250-50 MCG/DOSE AEPB Inhale 1 puff into the lungs 2 (two) times daily. As needed for cough/bronchitis 1 each 1  . glucose blood (ACCU-CHEK GUIDE) test strip Pt needs f/u appt w/PCP for refills 100 each PRN  . Lancets (FREESTYLE) lancets Use as instructed 100 each 99  . losartan-hydrochlorothiazide (HYZAAR) 100-25 MG tablet TAKE 1 TABLET BY MOUTH ONCE DAILY 90 tablet 3  . metFORMIN (GLUCOPHAGE) 500 MG tablet Take 1 tablet (500 mg total) by mouth daily with breakfast. Due for follow up visit w/PCP  30 tablet 0  . naproxen (NAPROSYN) 500 MG tablet TAKE 1 TABLET BY MOUTH 2 TIMES DAILY WITH A MEAL. 60 tablet 3  . omeprazole (PRILOSEC) 40 MG capsule TAKE 1 CAPSULE (40 MG TOTAL) BY MOUTH DAILY. 90 capsule 3  . Potassium 99 MG TABS Take 1 tablet (99 mg total) by mouth daily. 90 tablet 1  . Albuterol Sulfate 108 (90 Base) MCG/ACT AEPB Inhale 108 mcg into the lungs every 6 (six) hours as needed for wheezing.     No current facility-administered medications for this visit.     Allergies  Allergen Reactions  . Food     soy  . Soybean-Containing Drug  Products    Immunization History  Administered Date(s) Administered  . Influenza-Unspecified 05/06/2015, 05/28/2016  . Pneumococcal Polysaccharide-23 03/01/2012  . Tdap 12/14/2005, 06/12/2016       Review of Systems:  Constitutional:  No  fever, no chills, No recent illness, No unintentional weight changes. No significant fatigue.   HEENT: No  headache, no vision change, no hearing change, No sore throat, No  sinus pressure  Cardiac: No  chest pain, No  pressure, No palpitations, No  Orthopnea  Respiratory:  No  shortness of breath. No  Cough  Gastrointestinal: No  abdominal pain, No  nausea, No  vomiting,  No  blood in stool, No  diarrhea, No  constipation   Musculoskeletal: No new myalgia/arthralgia  Skin: No  Rash, No other wounds/concerning lesions  Genitourinary: No  incontinence, No  abnormal genital bleeding, No abnormal genital discharge  Hem/Onc: No  easy bruising/bleeding, No  abnormal lymph node  Endocrine: No cold intolerance,  No heat intolerance. No polyuria/polydipsia/polyphagia   Neurologic: No  weakness, No  dizziness, No  slurred speech/focal weakness/facial droop  Psychiatric: No  concerns with depression, No  concerns with anxiety, No sleep problems, No mood problems  Exam:  BP 129/82 (BP Location: Left Arm, Patient Position: Sitting, Cuff Size: Large)   Pulse 88   Temp 98.4 F (36.9 C) (Oral)   Wt 194 lb 12.8 oz (88.4 kg)   BMI 32.42 kg/m   Constitutional: VS see above. General Appearance: alert, well-developed, well-nourished, NAD  Eyes: Normal lids and conjunctive, non-icteric sclera  Ears, Nose, Mouth, Throat: MMM, Normal external inspection ears/nares/mouth/lips/gums. TM normal bilaterally. Pharynx/tonsils no erythema, no exudate. Nasal mucosa normal.   Neck: No masses, trachea midline. No thyroid enlargement. No tenderness/mass appreciated. No lymphadenopathy  Respiratory: Normal respiratory effort. no wheeze, no rhonchi, no  rales  Cardiovascular: S1/S2 normal, no murmur, no rub/gallop auscultated. RRR. No lower extremity edema. Pedal pulse II/IV bilaterally DP and PT. No carotid bruit or JVD. No abdominal aortic bruit.  Gastrointestinal: Nontender, no masses. No hepatomegaly, no splenomegaly. No hernia appreciated. Bowel sounds normal. Rectal exam deferred.   Musculoskeletal: Gait normal. No clubbing/cyanosis of digits.   Neurological: Normal balance/coordination. No tremor. No cranial nerve deficit on limited exam. Motor and sensation intact and symmetric. Cerebellar reflexes intact.   Skin: warm, dry, intact. No rash/ulcer. No concerning nevi or subq nodules on limited exam.    Psychiatric: Normal judgment/insight. Normal mood and affect. Oriented x3.      ASSESSMENT/PLAN: The primary encounter diagnosis was Annual physical exam. Diagnoses of Type 2 diabetes mellitus without complication, without long-term current use of insulin ([place]), Benign essential HTN, Gastroesophageal reflux disease, esophagitis presence not specified, Hyperlipidemia associated with type 2 diabetes mellitus ([place]), and Breast cancer screening were also pertinent to this visit.   Patient Instructions  Metformin from 500 mg daily to 1000 mg daily (2 tablets instead of 1) Sleep: Melatonin a few hours before going to bed   General Preventive Care  Most recent routine screening lipids/other labs: need cholesterol screening and metabolic panel done THIS WEEK  Tobacco: don't! Alcohol: moderation is ok for most people. Recreational/Illicit Drugs: don't!  Exercise: as tolerated to reduce risk of cardiovascular disease and diabetes  Mental health: if need for mental health care (medicines, counseling, other), or concerns about moods, please let me know!   Sexual health: if need for STD testing, or if pain/libido problems or bleeding, please let me or your OBGYN know!   Vaccines  Flu vaccine: recommended every fall (by  Halloween!)  Shingles vaccine: Shingrix recommended after age 42, many adults have already had Zostavax   Pneumonia vaccines: Prevnar and Pneumovax recommended after age 65, sooner if diabetes, COPD/asthma, others  Tetanus booster: Tdap recommended every 10 years  Cancer screenings   Colon cancer screening: colonoscopy will be due in 2021  Breast cancer screening: mammogram needed - you are overdue  Cervical cancer screening: we need records from most Pap most recently. Can stop at age 67 or w/ hysterectomy as long as previous testing was normal.   Other . Bone Density Test: recommended for women at age 75, men at age 40, sooner depending on risk factors . Advanced Directive: Living Will and/or Healthcare Power of Attorney recommended for everyone, regardless of age or health . Cholesterol: recommended screening annually . Thyroid and Vitamin D: routine screening not recommended, many insurance will not cover this test           Visit summary with medication list and pertinent instructions was printed for patient to review. All questions at time of visit were answered - patient instructed to contact office with any additional concerns. ER/RTC precautions were reviewed with the patient.   Follow-up plan: No follow-ups on file.   Please note: voice recognition software was used to produce this document, and typos may escape review. Please contact Dr. Sheppard Coil for any needed clarifications.

## 2018-04-03 NOTE — Patient Instructions (Addendum)
Metformin from 500 mg daily to 1000 mg daily (2 tablets instead of 1) Sleep: Melatonin a few hours before going to bed   General Preventive Care  Most recent routine screening lipids/other labs: need cholesterol screening and metabolic panel done THIS WEEK  Tobacco: don't! Alcohol: moderation is ok for most people. Recreational/Illicit Drugs: don't!  Exercise: as tolerated to reduce risk of cardiovascular disease and diabetes  Mental health: if need for mental health care (medicines, counseling, other), or concerns about moods, please let me know!   Sexual health: if need for STD testing, or if pain/libido problems or bleeding, please let me or your OBGYN know!   Vaccines  Flu vaccine: recommended every fall (by Halloween!)  Shingles vaccine: Shingrix recommended after age 76, many adults have already had Zostavax   Pneumonia vaccines: Prevnar and Pneumovax recommended after age 74, sooner if diabetes, COPD/asthma, others  Tetanus booster: Tdap recommended every 10 years  Cancer screenings   Colon cancer screening: colonoscopy will be due in 2021  Breast cancer screening: mammogram needed - you are overdue  Cervical cancer screening: we need records from most Pap most recently. Can stop at age 26 or w/ hysterectomy as long as previous testing was normal.   Other . Bone Density Test: recommended for women at age 66, men at age 42, sooner depending on risk factors . Advanced Directive: Living Will and/or Healthcare Power of Attorney recommended for everyone, regardless of age or health . Cholesterol: recommended screening annually . Thyroid and Vitamin D: routine screening not recommended, many insurance will not cover this test

## 2018-04-10 ENCOUNTER — Ambulatory Visit (INDEPENDENT_AMBULATORY_CARE_PROVIDER_SITE_OTHER): Payer: 59

## 2018-04-10 DIAGNOSIS — I10 Essential (primary) hypertension: Secondary | ICD-10-CM | POA: Diagnosis not present

## 2018-04-10 DIAGNOSIS — Z0000 Encounter for general adult medical examination without abnormal findings: Secondary | ICD-10-CM | POA: Diagnosis not present

## 2018-04-10 DIAGNOSIS — K219 Gastro-esophageal reflux disease without esophagitis: Secondary | ICD-10-CM | POA: Diagnosis not present

## 2018-04-10 DIAGNOSIS — E119 Type 2 diabetes mellitus without complications: Secondary | ICD-10-CM | POA: Diagnosis not present

## 2018-04-10 DIAGNOSIS — E785 Hyperlipidemia, unspecified: Secondary | ICD-10-CM | POA: Diagnosis not present

## 2018-04-10 DIAGNOSIS — Z1231 Encounter for screening mammogram for malignant neoplasm of breast: Secondary | ICD-10-CM

## 2018-04-10 DIAGNOSIS — E1169 Type 2 diabetes mellitus with other specified complication: Secondary | ICD-10-CM | POA: Diagnosis not present

## 2018-04-11 LAB — CBC
HEMATOCRIT: 41.1 % (ref 35.0–45.0)
HEMOGLOBIN: 13.1 g/dL (ref 11.7–15.5)
MCH: 24.8 pg — ABNORMAL LOW (ref 27.0–33.0)
MCHC: 31.9 g/dL — AB (ref 32.0–36.0)
MCV: 77.8 fL — ABNORMAL LOW (ref 80.0–100.0)
MPV: 13 fL — ABNORMAL HIGH (ref 7.5–12.5)
Platelets: 133 10*3/uL — ABNORMAL LOW (ref 140–400)
RBC: 5.28 10*6/uL — ABNORMAL HIGH (ref 3.80–5.10)
RDW: 14.7 % (ref 11.0–15.0)
WBC: 5.1 10*3/uL (ref 3.8–10.8)

## 2018-04-11 LAB — COMPLETE METABOLIC PANEL WITH GFR
AG Ratio: 1.2 (calc) (ref 1.0–2.5)
ALBUMIN MSPROF: 4.1 g/dL (ref 3.6–5.1)
ALKALINE PHOSPHATASE (APISO): 48 U/L (ref 33–130)
ALT: 18 U/L (ref 6–29)
AST: 21 U/L (ref 10–35)
BUN: 20 mg/dL (ref 7–25)
CALCIUM: 9.2 mg/dL (ref 8.6–10.4)
CO2: 34 mmol/L — ABNORMAL HIGH (ref 20–32)
CREATININE: 0.76 mg/dL (ref 0.50–0.99)
Chloride: 98 mmol/L (ref 98–110)
GFR, EST NON AFRICAN AMERICAN: 85 mL/min/{1.73_m2} (ref >=60)
GFR, Est African American: 98 mL/min/{1.73_m2} (ref >=60)
GLUCOSE: 110 mg/dL — AB (ref 65–99)
Globulin: 3.3 g/dL (calc) (ref 1.9–3.7)
Potassium: 4.3 mmol/L (ref 3.5–5.3)
Sodium: 140 mmol/L (ref 135–146)
Total Bilirubin: 0.6 mg/dL (ref 0.2–1.2)
Total Protein: 7.4 g/dL (ref 6.1–8.1)

## 2018-04-11 LAB — LIPID PANEL
CHOLESTEROL: 213 mg/dL — AB (ref <200)
HDL: 68 mg/dL (ref >50)
LDL Cholesterol (Calc): 128 mg/dL (calc) — ABNORMAL HIGH
Non-HDL Cholesterol (Calc): 145 mg/dL (calc) — ABNORMAL HIGH (ref <130)
TRIGLYCERIDES: 71 mg/dL (ref <150)
Total CHOL/HDL Ratio: 3.1 (calc) (ref <5.0)

## 2018-04-11 LAB — TSH: TSH: 1.1 m[IU]/L (ref 0.40–4.50)

## 2018-04-12 MED FILL — LOSARTAN-HCTZ 100-25 MG TAB: 100-25 | 90 days supply | Qty: 90 | Fill #3

## 2018-04-24 ENCOUNTER — Ambulatory Visit: Payer: 59 | Admitting: Osteopathic Medicine

## 2018-04-30 ENCOUNTER — Other Ambulatory Visit: Payer: Self-pay | Admitting: Osteopathic Medicine

## 2018-04-30 DIAGNOSIS — J3089 Other allergic rhinitis: Secondary | ICD-10-CM

## 2018-04-30 MED FILL — FLUTICASONE PROP 50 MCG SPR: 50 | 30 days supply | Qty: 16 | Fill #0

## 2018-05-01 ENCOUNTER — Ambulatory Visit (INDEPENDENT_AMBULATORY_CARE_PROVIDER_SITE_OTHER): Payer: 59 | Admitting: Osteopathic Medicine

## 2018-05-01 ENCOUNTER — Encounter: Payer: Self-pay | Admitting: Osteopathic Medicine

## 2018-05-01 VITALS — BP 160/90 | HR 78 | Temp 98.2°F | Wt 193.7 lb

## 2018-05-01 DIAGNOSIS — E1169 Type 2 diabetes mellitus with other specified complication: Secondary | ICD-10-CM | POA: Diagnosis not present

## 2018-05-01 DIAGNOSIS — R011 Cardiac murmur, unspecified: Secondary | ICD-10-CM | POA: Diagnosis not present

## 2018-05-01 DIAGNOSIS — R42 Dizziness and giddiness: Secondary | ICD-10-CM | POA: Diagnosis not present

## 2018-05-01 DIAGNOSIS — I10 Essential (primary) hypertension: Secondary | ICD-10-CM

## 2018-05-01 DIAGNOSIS — E785 Hyperlipidemia, unspecified: Secondary | ICD-10-CM

## 2018-05-01 MED ORDER — AMBULATORY NON FORMULARY MEDICATION: 99 refills | Status: AC

## 2018-05-01 MED FILL — SM BLOOD PRESSURE MONITOR: 10 days supply | Qty: 1 | Fill #0

## 2018-05-01 NOTE — Progress Notes (Signed)
HPI: Leah Nash is a 62 y.o. female who  has a past medical history of Diabetes mellitus without complication ([place]), GERD (gastroesophageal reflux disease), and Hypertension.  she presents to [facility] today, 05/01/18,  for chief complaint of: Dizziness/Vertigo  Dizziness for 2 weeks. Reports momentary lightheaded when getting up pr lying down, goes away without intervention. No nausea, no abd pain, no headache.   BP higher in the office today. But she reports low BP last week was 16/59 last week at work   We don't have a complete list of herbal supplements she's on      Past medical, surgical, social and family history reviewed:  Patient Active Problem List   Diagnosis Date Noted  . Allergic rhinitis due to allergen 01/30/2017  . Gastroesophageal reflux disease 01/30/2017  . Atypical squamous cells of undetermined significance on cytologic smear of cervix (ASC-US) 10/03/2016  . Failure to attend appointment 05/18/2016  . Colon, diverticulosis 05/20/2014  . Class 1 obesity 03/04/2013  . Plantar fasciitis, bilateral 02/14/2013  . Controlled type 2 diabetes mellitus without complication, without long-term current use of insulin ([place]) 12/29/2011  . Fibroid 01/27/2010  . Benign essential HTN 07/20/2009  . Asthma, mild intermittent 12/23/2008  . External hemorrhoid 11/26/2007    Past Surgical History:  Procedure Laterality Date  . none      Social History   Tobacco Use  . Smoking status: Never Smoker  . Smokeless tobacco: Never Used  Substance Use Topics  . Alcohol use: No    Alcohol/week: 0.0 standard drinks    Family History  Problem Relation Age of Onset  . Cancer Mother   . Breast cancer Mother   . Alcohol abuse Father   . Depression Daughter   . Diabetes Maternal Aunt   . Breast cancer Paternal Aunt      Current medication list and allergy/intolerance information reviewed:    Current Outpatient Medications   Medication Sig Dispense Refill  . benzonatate (TESSALON) 200 MG capsule Take 1 capsule (200 mg total) by mouth 3 (three) times daily as needed for cough. 30 capsule 0  . Cholecalciferol (VITAMIN D) 2000 units CAPS Take 1 capsule by mouth daily.    . fish oil-omega-3 fatty acids 1000 MG capsule Take 2 g by mouth daily.    . fluticasone (FLONASE) 50 MCG/ACT nasal spray INSTILL 1 TO 2 SPRAYS INTO BOTH NOSTRILS DAILY 16 g 1  . Fluticasone-Salmeterol (ADVAIR DISKUS) 250-50 MCG/DOSE AEPB Inhale 1 puff into the lungs 2 (two) times daily. As needed for cough/bronchitis 1 each 1  . glucose blood (ACCU-CHEK GUIDE) test strip Pt needs f/u appt w/PCP for refills 100 each PRN  . Lancets (FREESTYLE) lancets Use as instructed 100 each 99  . losartan-hydrochlorothiazide (HYZAAR) 100-25 MG tablet TAKE 1 TABLET BY MOUTH ONCE DAILY 90 tablet 3  . metFORMIN (GLUCOPHAGE) 500 MG tablet Take 2 tablets (1,000 mg total) by mouth daily with breakfast. 180 tablet 1  . naproxen (NAPROSYN) 500 MG tablet TAKE 1 TABLET BY MOUTH 2 TIMES DAILY WITH A MEAL. 60 tablet 3  . omeprazole (PRILOSEC) 40 MG capsule TAKE 1 CAPSULE (40 MG TOTAL) BY MOUTH DAILY. 90 capsule 3  . Potassium 99 MG TABS Take 1 tablet (99 mg total) by mouth daily. 90 tablet 1  . Albuterol Sulfate 108 (90 Base) MCG/ACT AEPB Inhale 108 mcg into the lungs every 6 (six) hours as needed for wheezing.     No current facility-administered medications  for this visit.     Allergies  Allergen Reactions  . Food     soy  . Soybean-Containing Drug Products       Review of Systems:  Constitutional:  No  fever, no chills, No recent illness, No unintentional weight changes. No significant fatigue.   HEENT: No  headache, no vision change  Cardiac: No  chest pain, No  pressure, No palpitations  Respiratory:  No  shortness of breath. No  Cough  Gastrointestinal: No  abdominal pain, No  nausea, No  vomiting,  No  blood in stool  Musculoskeletal: No new  myalgia/arthralgia  Skin: No  Rash  Hem/Onc: No  easy bruising/bleeding, No  abnormal lymph node  Endocrine: No cold intolerance,  No heat intolerance. No polyuria/polydipsia/polyphagia   Neurologic: No  weakness, +dizziness, No  slurred speech/focal weakness/facial droop  Psychiatric: No  concerns with depression, No  concerns with anxiety  Exam:  BP (!) 160/90   Pulse 78   Temp 98.2 F (36.8 C) (Oral)   Wt 193 lb 11.2 oz (87.9 kg)   BMI 32.23 kg/m   Constitutional: VS see above. General Appearance: alert, well-developed, well-nourished, NAD  Eyes: Normal lids and conjunctive, non-icteric sclera  Ears, Nose, Mouth, Throat: MMM, Normal external inspection ears/nares/mouth/lips/gums. TM normal bilaterally. Pharynx/tonsils no erythema, no exudate. Nasal mucosa normal.   Neck: No masses, trachea midline. No thyroid enlargement. No tenderness/mass appreciated. No lymphadenopathy  Respiratory: Normal respiratory effort. no wheeze, no rhonchi, no rales  Cardiovascular: S1/S2 normal, faint systolic murmur, no rub/gallop auscultated. RRR. No lower extremity edema.   Gastrointestinal: Nontender, no masses.   Musculoskeletal: Gait normal. No clubbing/cyanosis of digits.   Neurological: Normal balance/coordination. No tremor. No cranial nerve deficit on limited exam. Motor and sensation intact and symmetric. Cerebellar reflexes intact. EOMI, PERRL, no nystagums, Dix-Hallpike negative bilaterally   Skin: warm, dry, intact. No rash/ulcer. No concerning nevi or subq nodules on limited exam.    Psychiatric: Normal judgment/insight. Normal mood and affect. Oriented x3.  Orthostatic VS for the past 24 hrs (Last 3 readings):  BP- Lying Pulse- Lying BP- Sitting Pulse- Sitting BP- Standing at 0 minutes Pulse- Standing at 0 minutes  05/01/18 1047 (!) 161/92 80 165/89 85 154/89 87   No reproduction of symptoms w/ orthostatic maneuvers      ASSESSMENT/PLAN: The primary encounter  diagnosis was Orthostatic dizziness. Diagnoses of Benign essential HTN, Hyperlipidemia associated with type 2 diabetes mellitus ([place]), and Heart murmur were also pertinent to this visit.  Recent labs no concerns. Sounds orthostatic, transient. I don't have a good explanation for BP variability outside the office, pt nervous to adjust BP meds given low BP, will have her monitor these.    Patient Instructions  Will get echocardiogram and carotid ultrasound to evaluate heart function and blood flow to the brain.   Will see what BP looks like outside the office - Rx for home BP monitor. Write your number down and call/message [place] in a week or two to see how these are looking.   Please also call/message [place] with a list of all your herbal supplements!      Meds ordered this encounter  Medications  . AMBULATORY NON FORMULARY MEDICATION    Sig: Blood pressure monitor, automatic. Arm cuff.    Dispense:  1 Units    Refill:  prn      Visit summary with medication list and pertinent instructions was printed for patient to review. All questions at time of  visit were answered - patient instructed to contact office with any additional concerns. ER/RTC precautions were reviewed with the patient.   Follow-up plan: Return in about 2 weeks (around 05/15/2018) for recheck dizziness and BP, sooner if needed! .  Note: Total time spent 25 minutes, greater than 50% of the visit was spent face-to-face counseling and coordinating care for the following: The primary encounter diagnosis was Orthostatic dizziness. Diagnoses of Benign essential HTN and Hyperlipidemia associated with type 2 diabetes mellitus ([place]) were also pertinent to this visit.Marland Kitchen  Please note: voice recognition software was used to produce this document, and typos may escape review. Please contact Dr. Sheppard Coil for any needed clarifications.

## 2018-05-01 NOTE — Patient Instructions (Addendum)
Will get echocardiogram and carotid ultrasound to evaluate heart function and blood flow to the brain.   Will see what BP looks like outside the office - Rx for home BP monitor. Write your number down and call/message [place] in a week or two to see how these are looking.   Please also call/message [place] with a list of all your herbal supplements!

## 2018-05-02 ENCOUNTER — Encounter (HOSPITAL_COMMUNITY): Payer: Self-pay | Admitting: *Deleted

## 2018-05-02 ENCOUNTER — Other Ambulatory Visit: Payer: Self-pay | Admitting: Osteopathic Medicine

## 2018-05-02 ENCOUNTER — Emergency Department (HOSPITAL_COMMUNITY)
Admission: EM | Admit: 2018-05-02 | Discharge: 2018-05-02 | Disposition: A | Discharge status: Home / Self Care | Payer: 59 | Attending: Emergency Medicine | Admitting: Emergency Medicine

## 2018-05-02 DIAGNOSIS — Z79899 Other long term (current) drug therapy: Secondary | ICD-10-CM | POA: Diagnosis not present

## 2018-05-02 DIAGNOSIS — I10 Essential (primary) hypertension: Secondary | ICD-10-CM | POA: Insufficient documentation

## 2018-05-02 DIAGNOSIS — H8301 Labyrinthitis, right ear: Secondary | ICD-10-CM | POA: Insufficient documentation

## 2018-05-02 DIAGNOSIS — R42 Dizziness and giddiness: Secondary | ICD-10-CM

## 2018-05-02 DIAGNOSIS — Z7984 Long term (current) use of oral hypoglycemic drugs: Secondary | ICD-10-CM | POA: Diagnosis not present

## 2018-05-02 DIAGNOSIS — J45909 Unspecified asthma, uncomplicated: Secondary | ICD-10-CM | POA: Diagnosis not present

## 2018-05-02 DIAGNOSIS — E119 Type 2 diabetes mellitus without complications: Secondary | ICD-10-CM | POA: Diagnosis not present

## 2018-05-02 MED ORDER — DEXAMETHASONE 4 MG PO TABS
10.0000 mg | ORAL_TABLET | Freq: Once | ORAL | Status: AC
  Administered 2018-05-02: 10 mg via ORAL
  Filled 2018-05-02: qty 2

## 2018-05-02 MED ORDER — MECLIZINE HCL 25 MG PO TABS: 25.0000 mg | ORAL_TABLET | Freq: Three times a day (TID) | ORAL | 0 refills | Status: AC | PRN

## 2018-05-02 MED FILL — MECLIZINE 25 MG TABLET: 25 | 10 days supply | Qty: 30 | Fill #0

## 2018-05-02 NOTE — Progress Notes (Signed)
Order changed per radiology protocol.  

## 2018-05-02 NOTE — ED Notes (Signed)
ED Provider at bedside. 

## 2018-05-02 NOTE — ED Triage Notes (Signed)
Pt states her blood pressure has been elevated today. Pt took her BP medication today. Pt states the feels dizzy when lying down. Pt went to PCP yesterday, had orthostatics and a BP to measure at home.

## 2018-05-02 NOTE — Discharge Instructions (Signed)
Return to the ED for fever, inability to walk, worsening headache.

## 2018-05-02 NOTE — ED Provider Notes (Signed)
[facility] DEPT Provider Note   CSN: [id number] Arrival date & time: 05/02/18  1401     History   Chief Complaint Chief Complaint  Patient presents with  . Hypertension  . Dizziness  . Headache    HPI Leah Nash is a 61 y.o. female.  62 yo F with a chief complaint of dizziness.  Is been going on for the past couple days.  She checked her blood pressure and noted to be elevated.  She then went and saw her family physician yesterday.  She was noted to be orthostatic in the office and they told her to get a monitor and monitor her blood pressure at home.  She is been checking it multiple times today and felt that it is been persistently high.  She mostly concerned about that.  She describes the dizziness as a sensation of the room spinning.  Worse when she lays back flat or turns her head.  Stops when she holds her head still.  Only last for about a minute at a time.  The history is provided by the patient.  Hypertension  Associated symptoms include headaches (fullness). Pertinent negatives include no chest pain and no shortness of breath.  Dizziness  Associated symptoms: headaches (fullness)   Associated symptoms: no chest pain, no nausea, no palpitations, no shortness of breath and no vomiting   Headache   Pertinent negatives include no fever, no palpitations, no shortness of breath, no nausea and no vomiting.  Illness  This is a new problem. The current episode started yesterday. The problem occurs constantly. The problem has not changed since onset.Associated symptoms include headaches (fullness). Pertinent negatives include no chest pain and no shortness of breath. Nothing aggravates the symptoms. Nothing relieves the symptoms. She has tried nothing for the symptoms. The treatment provided no relief.    Past Medical History:  Diagnosis Date  . Diabetes mellitus without complication ([place])   . GERD (gastroesophageal reflux disease)   .  Hypertension     Patient Active Problem List   Diagnosis Date Noted  . Allergic rhinitis due to allergen 01/30/2017  . Gastroesophageal reflux disease 01/30/2017  . Atypical squamous cells of undetermined significance on cytologic smear of cervix (ASC-US) 10/03/2016  . Failure to attend appointment 05/18/2016  . Colon, diverticulosis 05/20/2014  . Class 1 obesity 03/04/2013  . Plantar fasciitis, bilateral 02/14/2013  . Controlled type 2 diabetes mellitus without complication, without long-term current use of insulin ([place]) 12/29/2011  . Fibroid 01/27/2010  . Benign essential HTN 07/20/2009  . Asthma, mild intermittent 12/23/2008  . External hemorrhoid 11/26/2007    Past Surgical History:  Procedure Laterality Date  . none       OB History   None      Home Medications    Prior to Admission medications   Medication Sig Start Date End Date Taking? Authorizing Provider  Albuterol Sulfate 108 (90 Base) MCG/ACT AEPB Inhale 108 mcg into the lungs every 6 (six) hours as needed for wheezing. 02/16/16 02/15/17  [provider]  AMBULATORY NON FORMULARY MEDICATION Blood pressure monitor, automatic. Arm cuff. 05/01/18   Emeterio Reeve, DO  Cholecalciferol (VITAMIN D) 2000 units CAPS Take 1 capsule by mouth daily.    [provider]  fish oil-omega-3 fatty acids 1000 MG capsule Take 2 g by mouth daily.    [provider]  fluticasone (FLONASE) 50 MCG/ACT nasal spray INSTILL 1 TO 2 SPRAYS INTO BOTH NOSTRILS DAILY 04/30/18  Emeterio Reeve, DO  glucose blood (ACCU-CHEK GUIDE) test strip Pt needs f/u appt w/PCP for refills 10/15/17   Emeterio Reeve, DO  Lancets (FREESTYLE) lancets Use as instructed 09/27/16   Emeterio Reeve, DO  losartan-hydrochlorothiazide [facility]) 100-25 MG tablet TAKE 1 TABLET BY MOUTH ONCE DAILY 07/10/17   Emeterio Reeve, DO  meclizine (ANTIVERT) 25 MG tablet Take 1 tablet (25 mg total) by mouth 3 (three) times daily as needed for  dizziness. 05/02/18   Deno Etienne, DO  metFORMIN (GLUCOPHAGE) 500 MG tablet Take 2 tablets (1,000 mg total) by mouth daily with breakfast. 04/03/18   Emeterio Reeve, DO  naproxen (NAPROSYN) 500 MG tablet TAKE 1 TABLET BY MOUTH 2 TIMES DAILY WITH A MEAL. 02/01/18   Emeterio Reeve, DO  omeprazole (PRILOSEC) 40 MG capsule TAKE 1 CAPSULE (40 MG TOTAL) BY MOUTH DAILY. 02/01/18   Emeterio Reeve, DO  Potassium 99 MG TABS Take 1 tablet (99 mg total) by mouth daily. 11/15/16   Emeterio Reeve, DO    Family History Family History  Problem Relation Age of Onset  . Cancer Mother   . Breast cancer Mother   . Alcohol abuse Father   . Depression Daughter   . Diabetes Maternal Aunt   . Breast cancer Paternal Aunt     Social History Social History   Tobacco Use  . Smoking status: Never Smoker  . Smokeless tobacco: Never Used  Substance Use Topics  . Alcohol use: No    Alcohol/week: 0.0 standard drinks  . Drug use: Not on file     Allergies   Food and Soybean-containing drug products   Review of Systems Review of Systems  Constitutional: Negative for chills and fever.  HENT: Negative for congestion and rhinorrhea.   Eyes: Negative for redness and visual disturbance.  Respiratory: Negative for shortness of breath and wheezing.   Cardiovascular: Negative for chest pain and palpitations.  Gastrointestinal: Negative for nausea and vomiting.  Genitourinary: Negative for dysuria and urgency.  Musculoskeletal: Negative for arthralgias and myalgias.  Skin: Negative for pallor and wound.  Neurological: Positive for dizziness and headaches (fullness).     Physical Exam Updated Vital Signs BP (!) 185/98 (BP Location: Left Arm)   Pulse 67   Temp 98.5 F (36.9 C) (Oral)   Resp 18   SpO2 100%   Physical Exam  Constitutional: She is oriented to person, place, and time. She appears well-developed and well-nourished. No distress.  HENT:  Head: Normocephalic and atraumatic.  Swollen  turbinates, posterior nasal drip, no noted sinus ttp, tm with bilateral effusions without erythema or bulging   Eyes: Pupils are equal, round, and reactive to light. EOM are normal.  Neck: Normal range of motion. Neck supple.  Cardiovascular: Normal rate and regular rhythm. Exam reveals no gallop and no friction rub.  No murmur heard. Pulmonary/Chest: Effort normal. She has no wheezes. She has no rales.  Abdominal: Soft. She exhibits no distension. There is no tenderness.  Musculoskeletal: She exhibits no edema or tenderness.  Neurological: She is alert and oriented to person, place, and time. She has normal strength. No cranial nerve deficit or sensory deficit. Coordination and gait normal. GCS eye subscore is 4. GCS verbal subscore is 5. GCS motor subscore is 6.  Right sided fast going nystagmus. Ambulates without difficulty  Skin: Skin is warm and dry. She is not diaphoretic.  Psychiatric: She has a normal mood and affect. Her behavior is normal.  Nursing note and vitals reviewed.  ED Treatments / Results  Labs (all labs ordered are listed, but only abnormal results are displayed) Labs Reviewed - No data to display  EKG None  Radiology No results found.  Procedures Procedures (including critical care time)  Medications Ordered in ED Medications  dexamethasone (DECADRON) tablet 10 mg (has no administration in time range)     Initial Impression / Assessment and Plan / ED Course  I have reviewed the triage vital signs and the nursing notes.  Pertinent labs & imaging results that were available during my care of the patient were reviewed by me and considered in my medical decision making (see chart for details).     62 yo F with a chief complaint of dizziness.  Sounds peripheral by history and physical.  Benign neuro exam.  She has been complaining of congestion and cough.  Bilateral tinnitus.  On exam she has bilateral effusions, no erythema or bulging.  Will treat as  labyrinthitis.  Have her follow-up with her PCP.   Patient asymptomatic with no noted s/s of end organ damage.  No chest pain, diaphoresis, nausea or other acs symptoms.  No unequal pulses, normal pulse ox without rales or sob.  Normal neuro exam. Feel this is unlikely to be a Hypertensive Emergency and recent studies suggest no benefit for inpatient admission.  There are also no studies to my knowledge suggesting that patients with hypertensive urgency have increased risk for end organ disease.The patient will follow up closely with their PCP.  Compliance with their medication stressed.    Lowell Guitar, Cicero Duck EH, et al. Characteristics and outcomes of patients presenting with hypertensive urgency in the office setting. JAMA Intern Med. 2016 Jul 1; 176(7): 981-8.   4:53 PM:  I have discussed the diagnosis/risks/treatment options with the patient and believe the pt to be eligible for discharge home to follow-up with PCP. We also discussed returning to the ED immediately if new or worsening sx occur. We discussed the sx which are most concerning (e.g., sudden worsening pain, fever, inability to tolerate by mouth) that necessitate immediate return. Medications administered to the patient during their visit and any new prescriptions provided to the patient are listed below.  Medications given during this visit Medications  dexamethasone (DECADRON) tablet 10 mg (has no administration in time range)      The patient appears reasonably screen and/or stabilized for discharge and I doubt any other medical condition or other [facility] requiring further screening, evaluation, or treatment in the ED at this time prior to discharge.    Final Clinical Impressions(s) / ED Diagnoses   Final diagnoses:  Labyrinthitis of right ear    ED Discharge Orders         Ordered    meclizine (ANTIVERT) 25 MG tablet  3 times daily PRN     05/02/18 1646           Deno Etienne, DO 05/02/18 1653

## 2018-05-03 ENCOUNTER — Other Ambulatory Visit (HOSPITAL_COMMUNITY)
Admission: RE | Admit: 2018-05-03 | Discharge: 2018-05-03 | Disposition: A | Discharge status: Home / Self Care | Payer: 59 | Source: Ambulatory Visit | Attending: Family Medicine | Admitting: Family Medicine

## 2018-05-03 ENCOUNTER — Encounter: Payer: Self-pay | Admitting: Osteopathic Medicine

## 2018-05-03 ENCOUNTER — Ambulatory Visit (INDEPENDENT_AMBULATORY_CARE_PROVIDER_SITE_OTHER): Payer: 59 | Admitting: Osteopathic Medicine

## 2018-05-03 VITALS — BP 170/89 | HR 81 | Temp 98.4°F | Wt 192.4 lb

## 2018-05-03 DIAGNOSIS — H8302 Labyrinthitis, left ear: Secondary | ICD-10-CM

## 2018-05-03 DIAGNOSIS — H9202 Otalgia, left ear: Secondary | ICD-10-CM

## 2018-05-03 DIAGNOSIS — I10 Essential (primary) hypertension: Secondary | ICD-10-CM | POA: Diagnosis not present

## 2018-05-03 DIAGNOSIS — R42 Dizziness and giddiness: Secondary | ICD-10-CM

## 2018-05-03 DIAGNOSIS — Z23 Encounter for immunization: Secondary | ICD-10-CM

## 2018-05-03 DIAGNOSIS — Z124 Encounter for screening for malignant neoplasm of cervix: Secondary | ICD-10-CM | POA: Diagnosis not present

## 2018-05-03 DIAGNOSIS — Z1151 Encounter for screening for human papillomavirus (HPV): Secondary | ICD-10-CM | POA: Diagnosis not present

## 2018-05-03 MED ORDER — AMOXICILLIN-POT CLAVULANATE 875-125 MG PO TABS: 1.0000 | ORAL_TABLET | Freq: Two times a day (BID) | ORAL | 0 refills | Status: DC

## 2018-05-03 MED FILL — AMOX-CLAV 875-125 MG TABLET: 875-125 | 7 days supply | Qty: 14 | Fill #0

## 2018-05-03 NOTE — Patient Instructions (Signed)
Plan:  Pap today  Will try antibiotics for possible sinus/ear infection   Continue steroids and dizziness medicine from ER  Consider bringing your home blood pressure monitor to the office so we can verify its accuracy.

## 2018-05-03 NOTE — Progress Notes (Signed)
HPI: Leah Nash is a 62 y.o. female who  has a past medical history of Diabetes mellitus without complication ([place]), GERD (gastroesophageal reflux disease), and Hypertension.  she presents to [facility] today, 05/03/18,  for chief complaint of:  Persistent dizziness & HTN - ER follow-up Pap (originally scheduled for today, pap only)   HTN: numbers was getting at home up to 165/103, 164/104 was worried and went to the ER. L ear felt like a lot of pressure. Treated for labyrinthitis w/ steroids and meclizine. Pt requests ENT referral. Reports L ear pressure and still feeling dizzy with position changes.   Here today for scheduled Pap, still wants to get this done. No dizziness or headache now.    Past medical history, surgical history, and family history reviewed.  Current medication list and allergy/intolerance information reviewed.   (See remainder of HPI, ROS, Phys Exam below)        ASSESSMENT/PLAN: The primary encounter diagnosis was Orthostatic dizziness. Diagnoses of Need for influenza vaccination, Benign essential HTN, Left ear pain, Dizziness, and Labyrinthitis of left ear were also pertinent to this visit.  I advised patient that we should really add a blood pressure medicine at this point, I think what ever low number she got at work was probably a fluke, she does not want to add blood pressure medicines before seeing if she can talk to ENT first.  Advised her that we certainly risk cardiac disease/other problems if we do not get the blood pressure under control.  Patient understands risks versus benefits.   Orders Placed This Encounter  Procedures  . Flu Vaccine QUAD 6+ mos PF IM (Fluarix Quad PF)  . Ambulatory referral to ENT    Referral Priority:   Routine    Referral Type:   Consultation    Referral Reason:   Specialty Services Required    Requested Specialty:   Otolaryngology    Number of Visits Requested:   1    Meds  ordered this encounter  Medications  . amoxicillin-clavulanate (AUGMENTIN) 875-125 MG tablet    Sig: Take 1 tablet by mouth 2 (two) times daily.    Dispense:  14 tablet    Refill:  0    Patient Instructions  Plan:  Pap today  Will try antibiotics for possible sinus/ear infection   Continue steroids and dizziness medicine from ER  Consider bringing your home blood pressure monitor to the office so we can verify its accuracy.     Follow-up plan: Return if symptoms worsen or fail to improve.                    ############################################ ############################################ ############################################ ############################################    Outpatient Encounter Medications as of 05/03/2018  Medication Sig Note  . AMBULATORY NON FORMULARY MEDICATION Blood pressure monitor, automatic. Arm cuff.   . Cholecalciferol (VITAMIN D) 2000 units CAPS Take 1 capsule by mouth daily.   . fish oil-omega-3 fatty acids 1000 MG capsule Take 2 g by mouth daily.   . fluticasone (FLONASE) 50 MCG/ACT nasal spray INSTILL 1 TO 2 SPRAYS INTO BOTH NOSTRILS DAILY   . glucose blood (ACCU-CHEK GUIDE) test strip Pt needs f/u appt w/PCP for refills   . Lancets (FREESTYLE) lancets Use as instructed   . losartan-hydrochlorothiazide (HYZAAR) 100-25 MG tablet TAKE 1 TABLET BY MOUTH ONCE DAILY   . meclizine (ANTIVERT) 25 MG tablet Take 1 tablet (25 mg total) by mouth 3 (three) times daily as needed  for dizziness.   . metFORMIN (GLUCOPHAGE) 500 MG tablet Take 2 tablets (1,000 mg total) by mouth daily with breakfast.   . naproxen (NAPROSYN) 500 MG tablet TAKE 1 TABLET BY MOUTH 2 TIMES DAILY WITH A MEAL. 04/03/2018: PRN  . omeprazole (PRILOSEC) 40 MG capsule TAKE 1 CAPSULE (40 MG TOTAL) BY MOUTH DAILY.   Marland Kitchen Potassium 99 MG TABS Take 1 tablet (99 mg total) by mouth daily.   . Albuterol Sulfate 108 (90 Base) MCG/ACT AEPB Inhale 108 mcg into the lungs  every 6 (six) hours as needed for wheezing. 08/31/2016: Received from: [place]: Inhale two puffs into the lungs every 6 (six) hours as needed for Wheezing.   No facility-administered encounter medications on file as of 05/03/2018.    Allergies  Allergen Reactions  . Food     soy  . Soybean-Containing Drug Products       Review of Systems:  Constitutional: No recent illness other than with HPI  HEENT: No  headache, no vision change, +ear pressure and sinus pressure as per HPI   Cardiac: No  chest pain, No  pressure, No palpitations  Respiratory:  No  shortness of breath. No  Cough  Gastrointestinal: No  abdominal pain  Musculoskeletal: No new myalgia/arthralgia  Skin: No  Rash  Neurologic: No  weakness, +Dizziness as per HPI  Exam:  BP (!) 170/89 (BP Location: Left Arm, Patient Position: Sitting, Cuff Size: Large)   Pulse 81   Temp 98.4 F (36.9 C) (Oral)   Wt 192 lb 6.4 oz (87.3 kg)   BMI 32.02 kg/m   Constitutional: VS see above. General Appearance: alert, well-developed, well-nourished, NAD  Eyes: Normal lids and conjunctive, non-icteric sclera  Ears, Nose, Mouth, Throat: MMM, Normal external inspection ears/nares/mouth/lips/gums. TM on L (+)clear effusion   Neck: No masses, trachea midline.   Respiratory: Normal respiratory effort.   Musculoskeletal: Gait normal. Symmetric and independent movement of all extremities  Neurological: Normal balance/coordination. No tremor.  Skin: warm, dry, intact.   Psychiatric: Normal judgment/insight. Normal mood and affect. Oriented x3.  GYN: No lesions/ulcers to external genitalia, normal urethra, normal vaginal mucosa for age, physiologic discharge, cervix normal without lesions, uterus not enlarged or tender, adnexa no masses and nontender  BREAST: No rashes/skin changes, normal fibrous breast tissue, no masses or tenderness, normal nipple without discharge, normal axilla   Visit summary with  medication list and pertinent instructions was printed for patient to review, advised to alert [place] if any changes needed. All questions at time of visit were answered - patient instructed to contact office with any additional concerns. ER/RTC precautions were reviewed with the patient and understanding verbalized.   Follow-up plan: Return if symptoms worsen or fail to improve.  Note: Total time spent 25 minutes, greater than 50% of the visit was spent face-to-face counseling and coordinating care for the following: The primary encounter diagnosis was Orthostatic dizziness. Diagnoses of Need for influenza vaccination, Benign essential HTN, Left ear pain, Dizziness, Labyrinthitis of left ear, and Cervical cancer screening were also pertinent to this visit.Marland Kitchen  Please note: voice recognition software was used to produce this document, and typos may escape review. Please contact Dr. Sheppard Coil for any needed clarifications.

## 2018-05-07 ENCOUNTER — Telehealth: Payer: Self-pay

## 2018-05-07 LAB — CYTOLOGY - PAP
Diagnosis: NEGATIVE
HPV: NOT DETECTED

## 2018-05-07 NOTE — Telephone Encounter (Signed)
Changing referral to [facility] ENT in [facility] they will call and schedule with patient for good appointment time - CF

## 2018-05-07 NOTE — Telephone Encounter (Signed)
Attempted to contact pt. No answer, unable to leave a vm msg.

## 2018-05-07 NOTE — Telephone Encounter (Signed)
Pt called stating she has an appt for ENT on 1030/19. Pt does not want to wait, requesting to see if she could see an ENT specialist in [facility]. Thanks.

## 2018-05-08 ENCOUNTER — Ambulatory Visit: Payer: 59 | Admitting: Osteopathic Medicine

## 2018-05-10 MED FILL — MECLIZINE 25 MG TABLET: 25 | 30 days supply | Qty: 90 | Fill #0

## 2018-05-20 ENCOUNTER — Ambulatory Visit (HOSPITAL_BASED_OUTPATIENT_CLINIC_OR_DEPARTMENT_OTHER): Admit: 2018-05-20 | Payer: 59

## 2018-05-29 NOTE — Telephone Encounter (Signed)
Pt informed of ENT referral on 05/10/18 & given contact office number to schedule an appt.

## 2018-05-30 ENCOUNTER — Other Ambulatory Visit: Payer: Self-pay | Admitting: Osteopathic Medicine

## 2018-05-30 MED FILL — FLUTICASONE PROP 50 MCG SPR: 50 | 30 days supply | Qty: 16 | Fill #1

## 2018-05-30 NOTE — Telephone Encounter (Signed)
Requested medication (s) are due for refill today: yes  Requested medication (s) are on the active medication list: yes  Last refill:  10/15/17  Future visit scheduled: no  Notes to clinic:  See request    Requested Prescriptions  Pending Prescriptions Disp Refills   ACCU-CHEK GUIDE test strip [Pharmacy Med Name: ACCU-CHEK GUIDE TEST STRIP STRP]  PRN    Sig: USE AS DIRECTED     There is no refill protocol information for this order

## 2018-06-19 ENCOUNTER — Other Ambulatory Visit: Payer: Self-pay | Admitting: Osteopathic Medicine

## 2018-06-19 DIAGNOSIS — J3089 Other allergic rhinitis: Secondary | ICD-10-CM

## 2018-06-19 MED FILL — OMEPRAZOLE 40 MG CPDR: 40 | 90 days supply | Qty: 90 | Fill #1

## 2018-06-19 MED FILL — ACCU-CHEK GUIDE TEST STRIP: 33 days supply | Qty: 100 | Fill #0

## 2018-06-19 NOTE — Telephone Encounter (Signed)
Please review for refill- patient at [facility] 

## 2018-07-08 ENCOUNTER — Other Ambulatory Visit: Payer: Self-pay | Admitting: Osteopathic Medicine

## 2018-07-08 DIAGNOSIS — I10 Essential (primary) hypertension: Secondary | ICD-10-CM

## 2018-07-17 ENCOUNTER — Telehealth: Payer: Self-pay

## 2018-07-17 MED ORDER — METFORMIN HCL ER 500 MG PO TB24: 2000.0000 mg | ORAL_TABLET | Freq: Every day | ORAL | 1 refills | Status: DC

## 2018-07-17 MED FILL — LOSARTAN-HCTZ 100-25 MG TAB: 100-25 | 30 days supply | Qty: 30 | Fill #0

## 2018-07-17 MED FILL — metFORMIN HCL ER 500 MG TB2: 500 | 30 days supply | Qty: 120 | Fill #0

## 2018-07-17 NOTE — Telephone Encounter (Signed)
Pt called requesting if provider could change metformin/ 2 pills daily rx  to metformin er / once daily. As per pt, she has not taken medication yet. Requesting med be sent to [place]. Pls advise, thanks.

## 2018-07-17 NOTE — Telephone Encounter (Signed)
Sent That high a dose at once will involve taking 4 pills at a time and may cause GI upset She needs to call pharmacy and cancel old metformin IR Rx

## 2018-07-18 NOTE — Telephone Encounter (Signed)
Called Pt, she reports she was taking 1000mg  of Regular Metformin daily. Questions why that was increased to 2000mg  daily. She does not want to take that much.   Questions since she is now on Metformin XR if she can try just 500mg  daily and see how her sugars run.   Routing.

## 2018-07-18 NOTE — Telephone Encounter (Signed)
Left VM for Pt advising that she should be taking at least 1000mg  daily. Callback provided for questions.

## 2018-07-18 NOTE — Telephone Encounter (Signed)
SHE SHOULD HAVE BEEN ON 1000 BID... so, 2000 daily.   She can take it however she feels she will tolerate it and we can discuss it further depending on next A1C result but I think 500 per day is too little, should be on at least 1000 daily and can take 2 of the 500 XR at once

## 2018-07-19 NOTE — Telephone Encounter (Signed)
Second vm msg left for pt regarding recent rx for metformin. Pt advised to make an appt w/provider if any further inquiries.

## 2018-07-29 MED FILL — NAPROXEN 500 MG TABLET: 500 | 30 days supply | Qty: 60 | Fill #1

## 2018-07-29 MED FILL — FLUTICASONE PROP 50 MCG SPR: 50 | 30 days supply | Qty: 16 | Fill #0

## 2018-08-06 ENCOUNTER — Ambulatory Visit (INDEPENDENT_AMBULATORY_CARE_PROVIDER_SITE_OTHER): Payer: 59 | Admitting: Osteopathic Medicine

## 2018-08-06 ENCOUNTER — Encounter: Payer: Self-pay | Admitting: Osteopathic Medicine

## 2018-08-06 VITALS — BP 134/79 | HR 89 | Temp 99.5°F | Wt 188.4 lb

## 2018-08-06 DIAGNOSIS — R6889 Other general symptoms and signs: Secondary | ICD-10-CM | POA: Diagnosis not present

## 2018-08-06 DIAGNOSIS — J101 Influenza due to other identified influenza virus with other respiratory manifestations: Secondary | ICD-10-CM | POA: Diagnosis not present

## 2018-08-06 LAB — POCT INFLUENZA A/B
INFLUENZA B, POC: NEGATIVE
Influenza A, POC: POSITIVE — AB

## 2018-08-06 MED ORDER — GUAIFENESIN-CODEINE 100-10 MG/5ML PO SYRP: 5.0000 mL | ORAL_SOLUTION | Freq: Four times a day (QID) | ORAL | 0 refills | Status: DC | PRN

## 2018-08-06 MED ORDER — OSELTAMIVIR PHOSPHATE 75 MG PO CAPS: 75.0000 mg | ORAL_CAPSULE | Freq: Two times a day (BID) | ORAL | 0 refills | Status: DC

## 2018-08-06 MED ORDER — ALBUTEROL SULFATE 108 (90 BASE) MCG/ACT IN AEPB: 1.0000 | INHALATION_SPRAY | RESPIRATORY_TRACT | 11 refills | Status: AC | PRN

## 2018-08-06 MED FILL — OSELTAMIVIR PHOSPHATE 75 MG: 75 | 5 days supply | Qty: 10 | Fill #0

## 2018-08-06 MED FILL — GUAIATUSSIN AC LIQUID: 100-10 | 5 days supply | Qty: 180 | Fill #0

## 2018-08-06 MED FILL — PROAIR RESPICLICK INHAL PWD: 108 (90 BAS | 25 days supply | Qty: 1 | Fill #0

## 2018-08-06 NOTE — Progress Notes (Signed)
HPI: Leah Nash is a 62 y.o. female who  has a past medical history of Diabetes mellitus without complication ([place]), GERD (gastroesophageal reflux disease), and Hypertension.  Leah Nash presents to [facility] today, 08/06/18,  for chief complaint of: Concern for flu  Few days of body aches, fever, runny nose, Concerned about asthma flare up, some SOB yesterday. Better today. Cough is persistent.   Results for orders placed or performed in visit on 08/06/18 (from the past 24 hour(s))  POCT Influenza A/B     Status: Abnormal   Collection Time: 08/06/18 10:50 AM  Result Value Ref Range   Influenza A, POC Positive (A) Negative   Influenza B, POC Negative Negative    Immunization History  Administered Date(s) Administered  . Influenza Split 05/16/2014  . Influenza, Seasonal, Injecte, Preservative Fre 05/06/2015  . Influenza,inj,Quad PF,6+ Mos 05/03/2018  . Influenza-Unspecified 05/06/2015, 05/28/2016  . Pneumococcal Polysaccharide-23 03/01/2012  . Tdap 12/14/2005, 06/12/2016       Past medical, surgical, social and family history reviewed and updated as necessary.   Current medication list and allergy/intolerance information reviewed:    Current Outpatient Medications  Medication Sig Dispense Refill  . ACCU-CHEK GUIDE test strip USE AS DIRECTED 100 each PRN  . Albuterol Sulfate 108 (90 Base) MCG/ACT AEPB Inhale 108 mcg into the lungs every 6 (six) hours as needed for wheezing.    . AMBULATORY NON FORMULARY MEDICATION Blood pressure monitor, automatic. Arm cuff. 1 Units prn  . amoxicillin-clavulanate (AUGMENTIN) 875-125 MG tablet Take 1 tablet by mouth 2 (two) times daily. 14 tablet 0  . Cholecalciferol (VITAMIN D) 2000 units CAPS Take 1 capsule by mouth daily.    . fish oil-omega-3 fatty acids 1000 MG capsule Take 2 g by mouth daily.    . fluticasone (FLONASE) 50 MCG/ACT nasal spray INSTILL 1 TO 2 SPRAYS INTO BOTH NOSTRILS DAILY 16 g 1  .  Lancets (FREESTYLE) lancets Use as instructed 100 each 99  . losartan-hydrochlorothiazide (HYZAAR) 100-25 MG tablet TAKE 1 TABLET BY MOUTH ONCE DAILY 90 tablet 1  . meclizine (ANTIVERT) 25 MG tablet Take 1 tablet (25 mg total) by mouth 3 (three) times daily as needed for dizziness. 30 tablet 0  . metFORMIN (GLUCOPHAGE XR) 500 MG 24 hr tablet Take 4 tablets (2,000 mg total) by mouth daily with breakfast. 120 tablet 1  . naproxen (NAPROSYN) 500 MG tablet TAKE 1 TABLET BY MOUTH 2 TIMES DAILY WITH A MEAL. 60 tablet 3  . omeprazole (PRILOSEC) 40 MG capsule TAKE 1 CAPSULE (40 MG TOTAL) BY MOUTH DAILY. 90 capsule 3  . Potassium 99 MG TABS Take 1 tablet (99 mg total) by mouth daily. 90 tablet 1   No current facility-administered medications for this visit.     Allergies  Allergen Reactions  . Food     soy  . Soybean-Containing Drug Products       Review of Systems:  Constitutional:  +fever, +chills, +recent illness, No unintentional weight changes. +significant fatigue.   HEENT: No  headache, no vision change, no hearing change, No sore throat, No  sinus pressure  Cardiac: No  chest pain, No  pressure, No palpitations  Respiratory:  +shortness of breath. +Cough  Gastrointestinal: No  abdominal pain, No  nausea, No  vomiting,  No  blood in stool, No  diarrhea  Musculoskeletal: + myalgia  Skin: No  Rash  Neurologic: No  weakness, No  dizziness  Exam:  BP 134/79 (BP Location:  Right Arm, Patient Position: Sitting, Cuff Size: Large)   Pulse 89   Temp 99.5 F (37.5 C) (Oral)   Wt 188 lb 6.4 oz (85.5 kg)   BMI 31.35 kg/m   Constitutional: VS see above. General Appearance: alert, well-developed, well-nourished, NAD  Eyes: Normal lids and conjunctive, non-icteric sclera  Ears, Nose, Mouth, Throat: MMM, Normal external inspection ears/nares/mouth/lips/gums. TM normal bilaterally. Pharynx/tonsils n+o erythema, no exudate. Nasal mucosa normal.   Neck: No masses, trachea midline. No  tenderness/mass appreciated. No lymphadenopathy  Respiratory: Normal respiratory effort. +wheeze, no rhonchi, no rales  Cardiovascular: S1/S2 normal, no murmur, no rub/gallop auscultated. RRR. No lower extremity edema.   Gastrointestinal: Nontender, no masses. Bowel sounds normal.  Musculoskeletal: Gait normal.   Neurological: Normal balance/coordination. No tremor.   Skin: warm, dry, intact.   Psychiatric: Normal judgment/insight. Normal mood and affect.   Results for orders placed or performed in visit on 08/06/18 (from the past 72 hour(s))  POCT Influenza A/B     Status: Abnormal   Collection Time: 08/06/18 10:50 AM  Result Value Ref Range   Influenza A, POC Positive (A) Negative   Influenza B, POC Negative Negative     ASSESSMENT/PLAN: The primary encounter diagnosis was Influenza A with respiratory manifestations. A diagnosis of Flu-like symptoms was also pertinent to this visit.   Likely asthma component, pt declined recommended steroids   Meds ordered this encounter  Medications  . oseltamivir (TAMIFLU) 75 MG capsule    Sig: Take 1 capsule (75 mg total) by mouth 2 (two) times daily. For 5 days    Dispense:  10 capsule    Refill:  0  . DISCONTD: guaiFENesin-codeine (ROBITUSSIN AC) 100-10 MG/5ML syrup    Sig: Take 5-10 mLs by mouth 4 (four) times daily as needed for cough.    Dispense:  180 mL    Refill:  0  . Albuterol Sulfate (PROAIR RESPICLICK) 149 (90 Base) MCG/ACT AEPB    Sig: Inhale 1-2 puffs into the lungs every 4 (four) hours as needed (wheezing, cough).    Dispense:  1 each    Refill:  11  . guaiFENesin-codeine (ROBITUSSIN AC) 100-10 MG/5ML syrup    Sig: Take 5-10 mLs by mouth 4 (four) times daily as needed for cough.    Dispense:  180 mL    Refill:  0    Orders Placed This Encounter  Procedures  . POCT Influenza A/B    Patient Instructions  Medications & Home Remedies for Upper Respiratory Illness    Aches/Pains, Fever, Headache OTC  Acetaminophen (Tylenol) 500 mg tablets - take max 2 tablets (1000 mg) every 6 hours (4 times per day)  OTC Ibuprofen (Motrin) 200 mg tablets - take max 4 tablets (800 mg) every 6 hours*   Sinus Congestion OTC Nasal Saline if desired to rinse OTC Oxymetolazone (Afrin, others) sparing use due to rebound congestion, NEVER use in kids OTC Phenylephrine (Sudafed) 10 mg tablets every 4 hours (or the 12-hour formulation) OTC Diphenhydramine (Benadryl) 25 mg tablets - take max 2 tablets every 4 hours   Cough & Sore Throat Prescription cough pills or syrups as directed OTC Dextromethorphan (Robitussin, others) - cough suppressant OTC Guaifenesin (Robitussin, Mucinex, others) - expectorant (helps cough up mucus) OTC Lozenges w/ Benzocaine + Menthol (Cepacol) Honey - as much as you want! Teas which "coat the throat" - look for ingredients Elm Bark, Licorice Root, Marshmallow Root   Other Prescription Oral Steroids to decrease inflammation  OTC Zinc Lozenges within  24 hours of symptoms onset - mixed evidence this shortens the duration of the common cold Don't waste your money on Vitamin C or Echinacea in acute illness - it's already too late!    *Caution in patients with high blood pressure              Influenza, Adult Influenza, more commonly known as "the flu," is a viral infection that primarily affects the respiratory tract. The respiratory tract includes organs that help you breathe, such as the lungs, nose, and throat. The flu causes many common cold symptoms, as well as a high fever and body aches. The flu spreads easily from person to person (is contagious). Getting a flu shot (influenza vaccination) every year is the best way to prevent influenza. What are the causes? Influenza is caused by a virus. You can catch the virus by:  Breathing in droplets from an infected person's cough or sneeze.  Touching something that was recently contaminated with the virus and then touching  your mouth, nose, or eyes.  What increases the risk? The following factors may make you more likely to get the flu:  Not cleaning your hands frequently with soap and water or alcohol-based hand sanitizer.  Having close contact with many people during cold and flu season.  Touching your mouth, eyes, or nose without washing or sanitizing your hands first.  Not drinking enough fluids or not eating a healthy diet.  Not getting enough sleep or exercise.  Being under a high amount of stress.  Not getting a yearly (annual) flu shot.  You may be at a higher risk of complications from the flu, such as a severe lung infection (pneumonia), if you:  Are over the age of 71.  Are pregnant.  Have a weakened disease-fighting system (immune system). You may have a weakened immune system if you: ? Have HIV or AIDS. ? Are undergoing chemotherapy. ? Aretaking medicines that reduce the activity of (suppress) the immune system.  Have a long-term (chronic) illness, such as heart disease, kidney disease, diabetes, or lung disease.  Have a liver disorder.  Are obese.  Have anemia.  What are the signs or symptoms? Symptoms of this condition typically last 4-10 days and may include:  Fever.  Chills.  Headache, body aches, or muscle aches.  Sore throat.  Cough.  Runny or congested nose.  Chest discomfort and cough.  Poor appetite.  Weakness or tiredness (fatigue).  Dizziness.  Nausea or vomiting.  How is this diagnosed? This condition may be diagnosed based on your medical history and a physical exam. Your health care provider may do a nose or throat swab test to confirm the diagnosis. How is this treated? If influenza is detected early, you can be treated with antiviral medicine that can reduce the length of your illness and the severity of your symptoms. This medicine may be given by mouth (orally) or through an IV tube that is inserted in one of your veins. The goal of  treatment is to relieve symptoms by taking care of yourself at home. This may include taking over-the-counter medicines, drinking plenty of fluids, and adding humidity to the air in your home. In some cases, influenza goes away on its own. Severe influenza or complications from influenza may be treated in a hospital. Follow these instructions at home:  Take over-the-counter and prescription medicines only as told by your health care provider.  Use a cool mist humidifier to add humidity to the air in your home.  This can make breathing easier.  Rest as needed.  Drink enough fluid to keep your urine clear or pale yellow.  Cover your mouth and nose when you cough or sneeze.  Wash your hands with soap and water often, especially after you cough or sneeze. If soap and water are not available, use hand sanitizer.  Stay home from work or school as told by your health care provider. Unless you are visiting your health care provider, try to avoid leaving home until your fever has been gone for 24 hours without the use of medicine.  Keep all follow-up visits as told by your health care provider. This is important. How is this prevented?  Getting an annual flu shot is the best way to avoid getting the flu. You may get the flu shot in late summer, fall, or winter. Ask your health care provider when you should get your flu shot.  Wash your hands often or use hand sanitizer often.  Avoid contact with people who are sick during cold and flu season.  Eat a healthy diet, drink plenty of fluids, get enough sleep, and exercise regularly. Contact a health care provider if:  You develop new symptoms.  You have: ? Chest pain. ? Diarrhea. ? A fever.  Your cough gets worse.  You produce more mucus.  You feel nauseous or you vomit. Get help right away if:  You develop shortness of breath or difficulty breathing.  Your skin or nails turn a bluish color.  You have severe pain or stiffness in your  neck.  You develop a sudden headache or sudden pain in your face or ear.  You cannot stop vomiting. This information is not intended to replace advice given to you by your health care provider. Make sure you discuss any questions you have with your health care provider. Document Released: 08/11/2000 Document Revised: 01/20/2016 Document Reviewed: 06/08/2015 Elsevier Interactive Patient Education  2017 [place].       Visit summary with medication list and pertinent instructions was printed for patient to review. All questions at time of visit were answered - patient instructed to contact office with any additional concerns or updates. ER/RTC precautions were reviewed with the patient.   Follow-up plan: Return if symptoms worsen or change - please call right away! .   Please note: voice recognition software was used to produce this document, and typos may escape review. Please contact Dr. Sheppard Coil for any needed clarifications.

## 2018-08-06 NOTE — Patient Instructions (Addendum)
Medications & Home Remedies for Upper Respiratory Illness    Aches/Pains, Fever, Headache OTC Acetaminophen (Tylenol) 500 mg tablets - take max 2 tablets (1000 mg) every 6 hours (4 times per day)  OTC Ibuprofen (Motrin) 200 mg tablets - take max 4 tablets (800 mg) every 6 hours*   Sinus Congestion OTC Nasal Saline if desired to rinse OTC Oxymetolazone (Afrin, others) sparing use due to rebound congestion, NEVER use in kids OTC Phenylephrine (Sudafed) 10 mg tablets every 4 hours (or the 12-hour formulation) OTC Diphenhydramine (Benadryl) 25 mg tablets - take max 2 tablets every 4 hours   Cough & Sore Throat Prescription cough pills or syrups as directed OTC Dextromethorphan (Robitussin, others) - cough suppressant OTC Guaifenesin (Robitussin, Mucinex, others) - expectorant (helps cough up mucus) OTC Lozenges w/ Benzocaine + Menthol (Cepacol) Honey - as much as you want! Teas which "coat the throat" - look for ingredients Elm Bark, Licorice Root, Marshmallow Root   Other Prescription Oral Steroids to decrease inflammation  OTC Zinc Lozenges within 24 hours of symptoms onset - mixed evidence this shortens the duration of the common cold Don't waste your money on Vitamin C or Echinacea in acute illness - it's already too late!    *Caution in patients with high blood pressure              Influenza, Adult Influenza, more commonly known as "the flu," is a viral infection that primarily affects the respiratory tract. The respiratory tract includes organs that help you breathe, such as the lungs, nose, and throat. The flu causes many common cold symptoms, as well as a high fever and body aches. The flu spreads easily from person to person (is contagious). Getting a flu shot (influenza vaccination) every year is the best way to prevent influenza. What are the causes? Influenza is caused by a virus. You can catch the virus by:  Breathing in droplets from an infected person's cough  or sneeze.  Touching something that was recently contaminated with the virus and then touching your mouth, nose, or eyes.  What increases the risk? The following factors may make you more likely to get the flu:  Not cleaning your hands frequently with soap and water or alcohol-based hand sanitizer.  Having close contact with many people during cold and flu season.  Touching your mouth, eyes, or nose without washing or sanitizing your hands first.  Not drinking enough fluids or not eating a healthy diet.  Not getting enough sleep or exercise.  Being under a high amount of stress.  Not getting a yearly (annual) flu shot.  You may be at a higher risk of complications from the flu, such as a severe lung infection (pneumonia), if you:  Are over the age of 85.  Are pregnant.  Have a weakened disease-fighting system (immune system). You may have a weakened immune system if you: ? Have HIV or AIDS. ? Are undergoing chemotherapy. ? Aretaking medicines that reduce the activity of (suppress) the immune system.  Have a long-term (chronic) illness, such as heart disease, kidney disease, diabetes, or lung disease.  Have a liver disorder.  Are obese.  Have anemia.  What are the signs or symptoms? Symptoms of this condition typically last 4-10 days and may include:  Fever.  Chills.  Headache, body aches, or muscle aches.  Sore throat.  Cough.  Runny or congested nose.  Chest discomfort and cough.  Poor appetite.  Weakness or tiredness (fatigue).  Dizziness.  Nausea or  vomiting.  How is this diagnosed? This condition may be diagnosed based on your medical history and a physical exam. Your health care provider may do a nose or throat swab test to confirm the diagnosis. How is this treated? If influenza is detected early, you can be treated with antiviral medicine that can reduce the length of your illness and the severity of your symptoms. This medicine may be given  by mouth (orally) or through an IV tube that is inserted in one of your veins. The goal of treatment is to relieve symptoms by taking care of yourself at home. This may include taking over-the-counter medicines, drinking plenty of fluids, and adding humidity to the air in your home. In some cases, influenza goes away on its own. Severe influenza or complications from influenza may be treated in a hospital. Follow these instructions at home:  Take over-the-counter and prescription medicines only as told by your health care provider.  Use a cool mist humidifier to add humidity to the air in your home. This can make breathing easier.  Rest as needed.  Drink enough fluid to keep your urine clear or pale yellow.  Cover your mouth and nose when you cough or sneeze.  Wash your hands with soap and water often, especially after you cough or sneeze. If soap and water are not available, use hand sanitizer.  Stay home from work or school as told by your health care provider. Unless you are visiting your health care provider, try to avoid leaving home until your fever has been gone for 24 hours without the use of medicine.  Keep all follow-up visits as told by your health care provider. This is important. How is this prevented?  Getting an annual flu shot is the best way to avoid getting the flu. You may get the flu shot in late summer, fall, or winter. Ask your health care provider when you should get your flu shot.  Wash your hands often or use hand sanitizer often.  Avoid contact with people who are sick during cold and flu season.  Eat a healthy diet, drink plenty of fluids, get enough sleep, and exercise regularly. Contact a health care provider if:  You develop new symptoms.  You have: ? Chest pain. ? Diarrhea. ? A fever.  Your cough gets worse.  You produce more mucus.  You feel nauseous or you vomit. Get help right away if:  You develop shortness of breath or difficulty  breathing.  Your skin or nails turn a bluish color.  You have severe pain or stiffness in your neck.  You develop a sudden headache or sudden pain in your face or ear.  You cannot stop vomiting. This information is not intended to replace advice given to you by your health care provider. Make sure you discuss any questions you have with your health care provider. Document Released: 08/11/2000 Document Revised: 01/20/2016 Document Reviewed: 06/08/2015 Elsevier Interactive Patient Education  2017 [employer].

## 2018-08-10 MED FILL — LOSARTAN-HCTZ 100-25 MG TAB: 100-25 | 30 days supply | Qty: 30 | Fill #1

## 2018-08-13 ENCOUNTER — Telehealth: Payer: Self-pay

## 2018-08-13 NOTE — Telephone Encounter (Signed)
Pt called stating that she continues to have severe wheezing & coughing. Pt is requesting another course of antibiotics sent to her pharmacy. Pls advise, thanks.

## 2018-08-13 NOTE — Telephone Encounter (Signed)
No. She declined steroids at her visit, I think this would be more helpful with her asthma history. No antibiotics without visit to reevaluate.

## 2018-08-14 NOTE — Telephone Encounter (Signed)
Pt has been updated and will make a f/u appt prn. No other inquiries during call.

## 2018-09-17 MED FILL — LOSARTAN-HCTZ 100-25 MG TAB: 100-25 | 30 days supply | Qty: 30 | Fill #2

## 2018-09-17 MED FILL — metFORMIN HCL ER 500 MG TB2: 500 | 30 days supply | Qty: 120 | Fill #1

## 2018-09-19 MED FILL — ACCU-CHEK GUIDE TEST STRIP: 33 days supply | Qty: 100 | Fill #1

## 2018-09-19 MED FILL — FLUTICASONE PROP 50 MCG SPR: 50 | 30 days supply | Qty: 16 | Fill #1

## 2018-10-09 ENCOUNTER — Ambulatory Visit (INDEPENDENT_AMBULATORY_CARE_PROVIDER_SITE_OTHER): Payer: 59 | Admitting: Osteopathic Medicine

## 2018-10-09 ENCOUNTER — Encounter: Payer: Self-pay | Admitting: Osteopathic Medicine

## 2018-10-09 VITALS — BP 145/83 | HR 71 | Temp 98.2°F | Wt 186.9 lb

## 2018-10-09 DIAGNOSIS — I10 Essential (primary) hypertension: Secondary | ICD-10-CM | POA: Diagnosis not present

## 2018-10-09 DIAGNOSIS — E119 Type 2 diabetes mellitus without complications: Secondary | ICD-10-CM | POA: Diagnosis not present

## 2018-10-09 DIAGNOSIS — J4520 Mild intermittent asthma, uncomplicated: Secondary | ICD-10-CM

## 2018-10-09 DIAGNOSIS — J3089 Other allergic rhinitis: Secondary | ICD-10-CM | POA: Diagnosis not present

## 2018-10-09 LAB — POCT GLYCOSYLATED HEMOGLOBIN (HGB A1C): HEMOGLOBIN A1C: 6.3 % — AB (ref 4.0–5.6)

## 2018-10-09 MED ORDER — SPACER/AERO-HOLDING CHAMBERS DEVI: 1 refills | Status: AC

## 2018-10-09 MED ORDER — AMLODIPINE BESYLATE 5 MG PO TABS: 5.0000 mg | ORAL_TABLET | Freq: Every day | ORAL | 0 refills | Status: DC

## 2018-10-09 MED ORDER — BECLOMETHASONE DIPROP HFA 40 MCG/ACT IN AERB: 1.0000 | INHALATION_SPRAY | Freq: Two times a day (BID) | RESPIRATORY_TRACT | 5 refills | Status: DC

## 2018-10-09 MED ORDER — BECLOMETHASONE DIPROP HFA 40 MCG/ACT IN AERB: 1.0000 | INHALATION_SPRAY | Freq: Two times a day (BID) | RESPIRATORY_TRACT | 5 refills | Status: AC

## 2018-10-09 MED ORDER — FLUTICASONE PROPIONATE 50 MCG/ACT NA SUSP: 2.0000 | Freq: Every day | NASAL | 3 refills | Status: DC

## 2018-10-09 MED FILL — AMLODIPINE BESYLATE 5 MG TA: 5 | 90 days supply | Qty: 90 | Fill #0

## 2018-10-09 MED FILL — QVAR REDIHALER 40 MCG/ACT A: 40 | 30 days supply | Qty: 11 | Fill #0

## 2018-10-09 MED FILL — LOSARTAN-HCTZ 100-25 MG TAB: 100-25 | 30 days supply | Qty: 30 | Fill #3

## 2018-10-09 NOTE — Patient Instructions (Signed)
Plan:  Diabetes: Sugars are under good control.  A1c has room to go a little bit lower though, be careful with carbohydrate intake and I always recommend increase exercise as tolerated.  Blood pressure: A bit above goal.  I have added a medication called amlodipine to take along with the losartan-hydrochlorothiazide.   Asthma: Lungs sound like you have got wheeze all over.  Let's add an inhaler to use for the next 2 to 3 weeks to see if this might help.  Please come see me again in another 2 weeks to recheck the breathing and recheck blood pressure on new medicine.  If doing well at that visit, can follow-up in another 3 months for routine diabetic checkup.

## 2018-10-09 NOTE — Progress Notes (Signed)
HPI: Leah Nash is a 63 y.o. female who  has a past medical history of Diabetes mellitus without complication ([place]), GERD (gastroesophageal reflux disease), and Hypertension.  she presents to [facility] today, 10/09/18,  for chief complaint of:  A1C - DM2 follow-up HTN follow-up  New: wheezing more often   DM2: we increased Metformin from 500 mg daily to 1000 mg daily. A1C today 6.3%   HTN: BP not at goal on intake was 150/82, hasn't been watching her sodium intake, reports was high yesterday as well.  BP Readings from Last 3 Encounters:  10/09/18 (!) 150/82  08/06/18 134/79  05/03/18 (!) 170/89    Asthma: wheezing more than usual, has stopped using albuterol d/t it makes her feel jittery, she's ok to take in the AM but in PM it keeps her up at night. UTD flu and pneumonia vaccines. Recent flu illness. No SOB or nighttime awakenings.           At today's visit 10/09/18 ... PMH, PSH, FH reviewed and updated as needed.  Current medication list and allergy/intolerance hx reviewed and updated as needed. (See remainder of HPI, ROS, Phys Exam below)    No results found for this or any previous visit (from the past 72 hour(s)).        ASSESSMENT/PLAN: The primary encounter diagnosis was Type 2 diabetes mellitus without complication, without long-term current use of insulin ([place]). Diagnoses of Benign essential HTN and Mild intermittent asthma without complication were also pertinent to this visit.   Orders Placed This Encounter  Procedures  . POCT HgB A1C     Meds ordered this encounter  Medications  . DISCONTD: beclomethasone (QVAR REDIHALER) 40 MCG/ACT inhaler    Sig: Inhale 1-2 puffs into the lungs 2 (two) times daily.    Dispense:  1 Inhaler    Refill:  5  . Spacer/Aero-Holding Josiah Lobo DEVI    Sig: Misc spacer for inhaler - disp per insurance coverage and patient preference.    Dispense:  1 each    Refill:  1  .  DISCONTD: amLODipine (NORVASC) 5 MG tablet    Sig: Take 1 tablet (5 mg total) by mouth daily.    Dispense:  90 tablet    Refill:  0  . amLODipine (NORVASC) 5 MG tablet    Sig: Take 1 tablet (5 mg total) by mouth daily.    Dispense:  90 tablet    Refill:  0  . beclomethasone (QVAR REDIHALER) 40 MCG/ACT inhaler    Sig: Inhale 1-2 puffs into the lungs 2 (two) times daily.    Dispense:  1 Inhaler    Refill:  5    Patient Instructions  Plan:  Diabetes: Sugars are under good control.  A1c has room to go a little bit lower though, be careful with carbohydrate intake and I always recommend increase exercise as tolerated.  Blood pressure: A bit above goal.  I have added a medication called amlodipine to take along with the losartan-hydrochlorothiazide.   Asthma: Lungs sound like you have got wheeze all over.  Let's add an inhaler to use for the next 2 to 3 weeks to see if this might help.  Please come see me again in another 2 weeks to recheck the breathing and recheck blood pressure on new medicine.  If doing well at that visit, can follow-up in another 3 months for routine diabetic checkup.       Follow-up plan: Return in  about 2 weeks (around 10/23/2018) for recheck BP and asthma .                                                 ################################################# ################################################# ################################################# #################################################    Current Meds  Medication Sig  . ACCU-CHEK GUIDE test strip USE AS DIRECTED  . Albuterol Sulfate (PROAIR RESPICLICK) 952 (90 Base) MCG/ACT AEPB Inhale 1-2 puffs into the lungs every 4 (four) hours as needed (wheezing, cough).  . AMBULATORY NON FORMULARY MEDICATION Blood pressure monitor, automatic. Arm cuff.  . Cholecalciferol (VITAMIN D) 2000 units CAPS Take 1 capsule by mouth daily.  . fish oil-omega-3 fatty  acids 1000 MG capsule Take 2 g by mouth daily.  . fluticasone (FLONASE) 50 MCG/ACT nasal spray INSTILL 1 TO 2 SPRAYS INTO BOTH NOSTRILS DAILY  . Lancets (FREESTYLE) lancets Use as instructed  . losartan-hydrochlorothiazide (HYZAAR) 100-25 MG tablet TAKE 1 TABLET BY MOUTH ONCE DAILY  . meclizine (ANTIVERT) 25 MG tablet Take 1 tablet (25 mg total) by mouth 3 (three) times daily as needed for dizziness.  . metFORMIN (GLUCOPHAGE XR) 500 MG 24 hr tablet Take 4 tablets (2,000 mg total) by mouth daily with breakfast.  . naproxen (NAPROSYN) 500 MG tablet TAKE 1 TABLET BY MOUTH 2 TIMES DAILY WITH A MEAL.  Marland Kitchen omeprazole (PRILOSEC) 40 MG capsule TAKE 1 CAPSULE (40 MG TOTAL) BY MOUTH DAILY.  Marland Kitchen Potassium 99 MG TABS Take 1 tablet (99 mg total) by mouth daily.    Allergies  Allergen Reactions  . Food     soy  . Soybean-Containing Drug Products        Review of Systems:  Constitutional: No recent illness  HEENT: +occasional headache, no vision change  Cardiac: No  chest pain, No  pressure, No palpitations  Respiratory:  No  shortness of breath. +Cough, +wheeze as per HPI  Musculoskeletal: No new myalgia/arthralgia  Skin: No  Rash  Neurologic: No  weakness, No  Dizziness   Exam:  BP (!) 150/82 (BP Location: Left Arm, Patient Position: Sitting, Cuff Size: Large)   Pulse 79   Temp 98.2 F (36.8 C) (Oral)   Wt 186 lb 14.4 oz (84.8 kg)   BMI 31.10 kg/m   Constitutional: VS see above. General Appearance: alert, well-developed, well-nourished, NAD  Eyes: Normal lids and conjunctive, non-icteric sclera  Ears, Nose, Mouth, Throat: MMM, Normal external inspection ears/nares/mouth/lips/gums.  Neck: No masses, trachea midline.   Respiratory: Normal respiratory effort. +diffuse bilateral wheeze, no rhonchi, no rales  Cardiovascular: S1/S2 normal, no murmur, no rub/gallop auscultated. RRR.   Musculoskeletal: Gait normal. Symmetric and independent movement of all  extremities  Neurological: Normal balance/coordination. No tremor.  Skin: warm, dry, intact.   Psychiatric: Normal judgment/insight. Normal mood and affect. Oriented x3.       Visit summary with medication list and pertinent instructions was printed for patient to review, patient was advised to alert [place] if any updates are needed. All questions at time of visit were answered - patient instructed to contact office with any additional concerns. ER/RTC precautions were reviewed with the patient and understanding verbalized.     Please note: voice recognition software was used to produce this document, and typos may escape review. Please contact Dr. Sheppard Coil for any needed clarifications.    Follow up plan: Return in about 2 weeks (around 10/23/2018)  for recheck BP and asthma .

## 2018-10-28 MED FILL — OMEPRAZOLE 40 MG CPDR: 40 | 90 days supply | Qty: 90 | Fill #2

## 2018-10-29 MED FILL — FLUTICASONE PROP 50 MCG SPR: 50 | 30 days supply | Qty: 16 | Fill #0

## 2018-11-21 ENCOUNTER — Other Ambulatory Visit: Payer: Self-pay | Admitting: Osteopathic Medicine

## 2018-11-21 MED FILL — NAPROXEN 500 MG TABLET: 500 | 30 days supply | Qty: 60 | Fill #2

## 2018-11-21 MED FILL — LOSARTAN-HCTZ 100-25 MG TAB: 100-25 | 30 days supply | Qty: 30 | Fill #4

## 2018-11-22 MED FILL — FLUTICASONE PROP 50 MCG SPR: 50 | 30 days supply | Qty: 16 | Fill #1

## 2018-11-25 ENCOUNTER — Other Ambulatory Visit: Payer: Self-pay

## 2018-11-25 ENCOUNTER — Other Ambulatory Visit: Payer: Self-pay | Admitting: Osteopathic Medicine

## 2018-11-25 MED FILL — metFORMIN HCL ER 500 MG TB2: 500 | 90 days supply | Qty: 180 | Fill #0

## 2018-11-25 NOTE — Telephone Encounter (Signed)
Medication sheet has different mg and directions.

## 2018-12-18 ENCOUNTER — Other Ambulatory Visit: Payer: Self-pay | Admitting: Osteopathic Medicine

## 2018-12-18 MED FILL — AMLODIPINE BESYLATE 5 MG TA: 5 | 90 days supply | Qty: 90 | Fill #0

## 2018-12-18 MED FILL — LOSARTAN-HCTZ 100-25 MG TAB: 100-25 | 30 days supply | Qty: 30 | Fill #5

## 2019-01-27 ENCOUNTER — Other Ambulatory Visit: Payer: Self-pay | Admitting: Osteopathic Medicine

## 2019-01-27 ENCOUNTER — Telehealth: Payer: Self-pay | Admitting: Osteopathic Medicine

## 2019-01-27 DIAGNOSIS — I10 Essential (primary) hypertension: Secondary | ICD-10-CM

## 2019-01-27 MED ORDER — LOSARTAN POTASSIUM-HCTZ 100-25 MG PO TABS: 1.0000 | ORAL_TABLET | Freq: Every day | ORAL | 0 refills | Status: DC

## 2019-01-27 MED FILL — LOSARTAN-HCTZ 100-25 MG TAB: 100-25 | 30 days supply | Qty: 30 | Fill #0

## 2019-01-27 NOTE — Telephone Encounter (Signed)
Patient states that she is out of her BP medication. Has been out for 5 days. Would like to know if it can be called in and she is coming in for a visit on 06/10. Please advise.

## 2019-01-27 NOTE — Telephone Encounter (Signed)
Refill sent but pt needs to keep upcoming appt thanks!

## 2019-01-28 NOTE — Telephone Encounter (Signed)
Pt advised.

## 2019-02-05 ENCOUNTER — Encounter: Payer: Self-pay | Admitting: Osteopathic Medicine

## 2019-02-05 ENCOUNTER — Ambulatory Visit (INDEPENDENT_AMBULATORY_CARE_PROVIDER_SITE_OTHER): Payer: 59 | Admitting: Osteopathic Medicine

## 2019-02-05 VITALS — BP 144/81 | HR 75 | Temp 97.9°F | Wt 182.6 lb

## 2019-02-05 DIAGNOSIS — E119 Type 2 diabetes mellitus without complications: Secondary | ICD-10-CM | POA: Diagnosis not present

## 2019-02-05 DIAGNOSIS — J3089 Other allergic rhinitis: Secondary | ICD-10-CM

## 2019-02-05 DIAGNOSIS — Z0000 Encounter for general adult medical examination without abnormal findings: Secondary | ICD-10-CM

## 2019-02-05 DIAGNOSIS — K219 Gastro-esophageal reflux disease without esophagitis: Secondary | ICD-10-CM | POA: Diagnosis not present

## 2019-02-05 DIAGNOSIS — I10 Essential (primary) hypertension: Secondary | ICD-10-CM | POA: Diagnosis not present

## 2019-02-05 LAB — POCT GLYCOSYLATED HEMOGLOBIN (HGB A1C): Hemoglobin A1C: 6.5 % — AB (ref 4.0–5.6)

## 2019-02-05 MED ORDER — AMLODIPINE BESYLATE 5 MG PO TABS: 5.0000 mg | ORAL_TABLET | Freq: Every day | ORAL | 1 refills | Status: DC

## 2019-02-05 MED ORDER — LOSARTAN POTASSIUM-HCTZ 100-25 MG PO TABS: 1.0000 | ORAL_TABLET | Freq: Every day | ORAL | 1 refills | Status: DC

## 2019-02-05 MED ORDER — FLUTICASONE PROPIONATE 50 MCG/ACT NA SUSP: 2.0000 | Freq: Every day | NASAL | 3 refills | Status: DC

## 2019-02-05 MED ORDER — OMEPRAZOLE 40 MG PO CPDR: 40.0000 mg | DELAYED_RELEASE_CAPSULE | Freq: Every day | ORAL | 3 refills | Status: DC

## 2019-02-05 MED ORDER — METFORMIN HCL ER 500 MG PO TB24: 1000.0000 mg | ORAL_TABLET | Freq: Every day | ORAL | 1 refills | Status: DC

## 2019-02-05 MED FILL — metFORMIN HCL ER 500 MG TB2: 500 | 30 days supply | Qty: 60 | Fill #0

## 2019-02-05 MED FILL — OMEPRAZOLE 40 MG CPDR: 40 | 90 days supply | Qty: 90 | Fill #0

## 2019-02-05 MED FILL — FLUTICASONE PROP 50 MCG SPR: 50 | 90 days supply | Qty: 48 | Fill #0

## 2019-02-05 NOTE — Progress Notes (Signed)
HPI: Leah Nash is a 63 y.o. female who  has a past medical history of Diabetes mellitus without complication ([place]), GERD (gastroesophageal reflux disease), and Hypertension.  she presents to [facility] today, 02/05/19,  for chief complaint of:  A1C - DM2 follow-up HTN follow-up    DM2: we increased Metformin from 500 mg daily to 1000 mg daily after A1C was 6.5 in 03/2018. A1C at last visit 10/09/18: 6.3% and today 02/05/19: 6.5% patient notes less than ideal adherence to diabetic diet and exercise.   HTN: BP not at goal, pt states took meds just prior to arrival.  BP Readings from Last 3 Encounters:  02/05/19 (!) 144/81  10/09/18 (!) 145/83  08/06/18 134/79      Wt Readings from Last 3 Encounters:  02/05/19 182 lb 9.6 oz (82.8 kg)  10/09/18 186 lb 14.4 oz (84.8 kg)  08/06/18 188 lb 6.4 oz (85.5 kg)         At today's visit 02/05/19 ... PMH, PSH, FH reviewed and updated as needed.  Current medication list and allergy/intolerance hx reviewed and updated as needed. (See remainder of HPI, ROS, Phys Exam below)    Results for orders placed or performed in visit on 02/05/19 (from the past 72 hour(s))  POCT HgB A1C     Status: Abnormal   Collection Time: 02/05/19 10:04 AM  Result Value Ref Range   Hemoglobin A1C 6.5 (A) 4.0 - 5.6 %   HbA1c POC (<> result, manual entry)     HbA1c, POC (prediabetic range)     HbA1c, POC (controlled diabetic range)            ASSESSMENT/PLAN: The primary encounter diagnosis was Type 2 diabetes mellitus without complication, without long-term current use of insulin ([place]). Diagnoses of Benign essential HTN, Annual physical exam, Seasonal allergic rhinitis due to other allergic trigger, Essential hypertension, and Gastroesophageal reflux disease, esophagitis presence not specified were also pertinent to this visit.  Patient will be moving to the [place] area in October, we will get her annual  checkup done before then.  Orders Placed This Encounter  Procedures  . CBC  . COMPLETE METABOLIC PANEL WITH GFR  . Lipid panel  . Hemoglobin A1c  . POCT HgB A1C   Labs ordered for future visit. Annual physical / preventive care was NOT performed or billed today.    Follow-up plan: Return in about 3 months (around 05/08/2019) for annual physical - labs prior to visit (orders are in) .                                                 ################################################# ################################################# ################################################# #################################################    Current Meds  Medication Sig  . ACCU-CHEK GUIDE test strip USE AS DIRECTED  . Albuterol Sulfate (PROAIR RESPICLICK) 010 (90 Base) MCG/ACT AEPB Inhale 1-2 puffs into the lungs every 4 (four) hours as needed (wheezing, cough).  . AMBULATORY NON FORMULARY MEDICATION Blood pressure monitor, automatic. Arm cuff.  Marland Kitchen amLODipine (NORVASC) 5 MG tablet TAKE 1 TABLET BY MOUTH ONCE A DAY  . beclomethasone (QVAR REDIHALER) 40 MCG/ACT inhaler Inhale 1-2 puffs into the lungs 2 (two) times daily.  . Cholecalciferol (VITAMIN D) 2000 units CAPS Take 1 capsule by mouth daily.  . fish oil-omega-3 fatty acids 1000 MG capsule Take 2 g by mouth  daily.  . fluticasone (FLONASE) 50 MCG/ACT nasal spray Place 2 sprays into both nostrils daily.  . Lancets (FREESTYLE) lancets Use as instructed  . losartan-hydrochlorothiazide (HYZAAR) 100-25 MG tablet Take 1 tablet by mouth daily.  . meclizine (ANTIVERT) 25 MG tablet Take 1 tablet (25 mg total) by mouth 3 (three) times daily as needed for dizziness.  . metFORMIN (GLUCOPHAGE-XR) 500 MG 24 hr tablet Take 2 tablets (1,000 mg total) by mouth daily with breakfast.  . naproxen (NAPROSYN) 500 MG tablet TAKE 1 TABLET BY MOUTH 2 TIMES DAILY WITH A MEAL.  Marland Kitchen omeprazole (PRILOSEC) 40 MG capsule TAKE 1  CAPSULE (40 MG TOTAL) BY MOUTH DAILY.  Marland Kitchen Potassium 99 MG TABS Take 1 tablet (99 mg total) by mouth daily.  Marland Kitchen Spacer/Aero-Holding Chambers DEVI Misc spacer for inhaler - disp per insurance coverage and patient preference.    Allergies  Allergen Reactions  . Food     soy  . Soybean-Containing Drug Products        Review of Systems:  Constitutional: No recent illness  HEENT: No headache, no vision change  Cardiac: No  chest pain, No  pressure, No palpitations  Respiratory:  No  shortness of breath. No cough  Neurologic: No  weakness, No  Dizziness   Exam:  BP (!) 144/81 (BP Location: Left Arm, Patient Position: Sitting, Cuff Size: Normal)   Pulse 75   Temp 97.9 F (36.6 C) (Oral)   Wt 182 lb 9.6 oz (82.8 kg)   BMI 30.39 kg/m   Constitutional: VS see above. General Appearance: alert, well-developed, well-nourished, NAD  Eyes: Normal lids and conjunctive, non-icteric sclera  Ears, Nose, Mouth, Throat: MMM, Normal external inspection ears/nares/mouth/lips/gums.  Neck: No masses, trachea midline.   Respiratory: Normal respiratory effort.   Musculoskeletal: Gait normal. Symmetric and independent movement of all extremities  Neurological: Normal balance/coordination. No tremor.  Skin: warm, dry, intact.   Psychiatric: Normal judgment/insight. Normal mood and affect. Oriented x3.       Visit summary with medication list and pertinent instructions was printed for patient to review, patient was advised to alert [place] if any updates are needed. All questions at time of visit were answered - patient instructed to contact office with any additional concerns. ER/RTC precautions were reviewed with the patient and understanding verbalized.   Total time spent: 25 minutes, greater than 50% of the visit was spent face-to-face care for the diagnoses listed in assessment/plan  Please note: voice recognition software was used to produce this document, and typos may escape review.  Please contact Dr. Sheppard Coil for any needed clarifications.    Follow up plan: Return in about 3 months (around 05/08/2019) for annual physical - labs prior to visit (orders are in) .

## 2019-02-25 MED FILL — LOSARTAN-HCTZ 100-25 MG TAB: 100-25 | 30 days supply | Qty: 30 | Fill #0

## 2019-02-28 MED FILL — AMLODIPINE BESYLATE 5 MG TA: 5 | 90 days supply | Qty: 90 | Fill #0

## 2019-03-27 MED FILL — LOSARTAN-HCTZ 100-25 MG TAB: 100-25 | 30 days supply | Qty: 30 | Fill #0

## 2019-04-25 ENCOUNTER — Telehealth: Payer: Self-pay | Admitting: Osteopathic Medicine

## 2019-04-25 NOTE — Telephone Encounter (Signed)
Patient said she will call us back to reschedule. Appointment has been cancelled. No further questions at this time.

## 2019-04-25 NOTE — Telephone Encounter (Signed)
Patient called in, states that she is moving up [place] and needs a Physical Tuesday morning if all possible. No appropriate slots open. Please Advise.

## 2019-04-25 NOTE — Telephone Encounter (Signed)
Sorry, I'd like to keep the acute slots open since I'll be out of the office on Monday. She can be scheduled as usual at another time.

## 2019-04-29 MED FILL — FLUTICASONE PROP 50 MCG SPR: 50 | 90 days supply | Qty: 48 | Fill #1

## 2019-04-29 MED FILL — LOSARTAN-HCTZ 100-25 MG TAB: 100-25 | 30 days supply | Qty: 30 | Fill #1

## 2019-04-29 MED FILL — metFORMIN HCL ER 500 MG TB2: 500 | 90 days supply | Qty: 180 | Fill #1

## 2019-05-28 ENCOUNTER — Ambulatory Visit: Payer: 59 | Admitting: Osteopathic Medicine

## 2019-06-16 ENCOUNTER — Encounter: Payer: 59 | Admitting: Osteopathic Medicine

## 2019-06-19 ENCOUNTER — Encounter: Payer: Self-pay | Admitting: Osteopathic Medicine

## 2019-06-19 ENCOUNTER — Ambulatory Visit (INDEPENDENT_AMBULATORY_CARE_PROVIDER_SITE_OTHER): Payer: 59 | Admitting: Osteopathic Medicine

## 2019-06-19 ENCOUNTER — Other Ambulatory Visit: Payer: Self-pay

## 2019-06-19 VITALS — BP 117/70 | HR 73 | Temp 97.9°F | Wt 190.1 lb

## 2019-06-19 DIAGNOSIS — Z1231 Encounter for screening mammogram for malignant neoplasm of breast: Secondary | ICD-10-CM

## 2019-06-19 DIAGNOSIS — I10 Essential (primary) hypertension: Secondary | ICD-10-CM | POA: Diagnosis not present

## 2019-06-19 DIAGNOSIS — J3089 Other allergic rhinitis: Secondary | ICD-10-CM | POA: Diagnosis not present

## 2019-06-19 DIAGNOSIS — J4520 Mild intermittent asthma, uncomplicated: Secondary | ICD-10-CM | POA: Diagnosis not present

## 2019-06-19 DIAGNOSIS — E119 Type 2 diabetes mellitus without complications: Secondary | ICD-10-CM | POA: Diagnosis not present

## 2019-06-19 DIAGNOSIS — K219 Gastro-esophageal reflux disease without esophagitis: Secondary | ICD-10-CM

## 2019-06-19 DIAGNOSIS — Z23 Encounter for immunization: Secondary | ICD-10-CM | POA: Diagnosis not present

## 2019-06-19 DIAGNOSIS — E785 Hyperlipidemia, unspecified: Secondary | ICD-10-CM

## 2019-06-19 DIAGNOSIS — Z0000 Encounter for general adult medical examination without abnormal findings: Secondary | ICD-10-CM

## 2019-06-19 DIAGNOSIS — Z111 Encounter for screening for respiratory tuberculosis: Secondary | ICD-10-CM | POA: Diagnosis not present

## 2019-06-19 DIAGNOSIS — E1169 Type 2 diabetes mellitus with other specified complication: Secondary | ICD-10-CM

## 2019-06-19 LAB — POCT GLYCOSYLATED HEMOGLOBIN (HGB A1C): Hemoglobin A1C: 6.8 % — AB (ref 4.0–5.6)

## 2019-06-19 MED ORDER — FLUTICASONE PROPIONATE 50 MCG/ACT NA SUSP: 2.0000 | Freq: Every day | NASAL | 3 refills | Status: DC

## 2019-06-19 MED ORDER — METFORMIN HCL ER 500 MG PO TB24: 1000.0000 mg | ORAL_TABLET | Freq: Every day | ORAL | 0 refills | Status: DC

## 2019-06-19 MED ORDER — LOSARTAN POTASSIUM-HCTZ 100-25 MG PO TABS: 1.0000 | ORAL_TABLET | Freq: Every day | ORAL | 0 refills | Status: DC

## 2019-06-19 MED ORDER — AMLODIPINE BESYLATE 5 MG PO TABS: 5.0000 mg | ORAL_TABLET | Freq: Every day | ORAL | 0 refills | Status: DC

## 2019-06-19 MED ORDER — OMEPRAZOLE 40 MG PO CPDR: 40.0000 mg | DELAYED_RELEASE_CAPSULE | Freq: Every day | ORAL | 0 refills | Status: DC

## 2019-06-19 MED FILL — OMEPRAZOLE 40 MG CPDR: 40 | 90 days supply | Qty: 90 | Fill #0

## 2019-06-19 NOTE — Patient Instructions (Addendum)
General Preventive Care  Most recent routine screening lipids/other labs: need blood drawn!  Tobacco: don't!   Alcohol: responsible moderation is ok for most adults - if you have concerns about your alcohol intake, please talk to me!   Exercise: as tolerated to reduce risk of cardiovascular disease and diabetes. Strength training will also prevent osteoporosis.   Mental health: if need for mental health care (medicines, counseling, other), or concerns about moods, please let me know!   Sexual health: if need for STD testing, or if concerns with libido/pain problems, please let me know!   Advanced Directive: Living Will and/or Healthcare Power of Attorney recommended for all adults, regardless of age or health.  Vaccines - see below for records   Flu vaccine: recommended for almost everyone, every fall.   Shingles vaccine: Shingrix recommended after age 35.   Pneumonia vaccines: recommended booster after age 72  Tetanus booster: Tdap recommended every 10 years.  Cancer screenings   Colon cancer screening: due 05/2020.   Breast cancer screening: mammogram ordered today.   Cervical cancer screening: Pap due 04/2023.   Lung cancer screening: not needed for non-smokers  Infection screenings . HIV: done 05/2016 and negative! . Gonorrhea/Chlamydia: screening as needed . Hepatitis C: done 05/2016 and negative! . TB: certain at-risk populations, or depending on work requirements and/or travel history Other  Bone Density Test: recommended for women at age 42    Immunization History  Administered Date(s) Administered  . Influenza Split 05/16/2014  . Influenza, Seasonal, Injecte, Preservative Fre 05/06/2015  . Influenza,inj,Quad PF,6+ Mos 05/03/2018, 06/19/2019  . Influenza-Unspecified 05/06/2015, 05/28/2016  . Pneumococcal Polysaccharide-23 03/01/2012  . Tdap 12/14/2005, 06/12/2016

## 2019-06-19 NOTE — Progress Notes (Signed)
HPI: Leah Nash is a 63 y.o. female who  has a past medical history of Diabetes mellitus without complication ([place]), GERD (gastroesophageal reflux disease), and Hypertension.  she presents to [facility] today, 06/19/19,  for chief complaint of: annual visit     Patient here for annual physical / wellness exam.  See preventive care reviewed as below.    Additional concerns today include:    DM2:  Currently taking Metformin 1000 mg daily. A1C today is 6.8. Last A1Cs were 6.5% (02/05/19) and 6.3% (10/09/18). Not taking blood sugars at home.  Patient reports that she has been lowering her carb intake but admits to not exercising much outside of walking. Plans to establish an exercise program after moving to [place].    HTN:  BP at goal today, patient hasn't taken meds today. Currently on amlodopine 5 mg and losartan-HCTZ 100-25 mg daily.    Past medical, surgical, social and family history reviewed and updated as necessary.   Current medication list and allergy/intolerance information reviewed:    Current Outpatient Medications  Medication Sig Dispense Refill  . ACCU-CHEK GUIDE test strip USE AS DIRECTED 100 each PRN  . Albuterol Sulfate (PROAIR RESPICLICK) 123XX123 (90 Base) MCG/ACT AEPB Inhale 1-2 puffs into the lungs every 4 (four) hours as needed (wheezing, cough). 1 each 11  . AMBULATORY NON FORMULARY MEDICATION Blood pressure monitor, automatic. Arm cuff. 1 Units prn  . amLODipine (NORVASC) 5 MG tablet Take 1 tablet (5 mg total) by mouth daily. 90 tablet 1  . beclomethasone (QVAR REDIHALER) 40 MCG/ACT inhaler Inhale 1-2 puffs into the lungs 2 (two) times daily. 1 Inhaler 5  . Cholecalciferol (VITAMIN D) 2000 units CAPS Take 1 capsule by mouth daily.    . fish oil-omega-3 fatty acids 1000 MG capsule Take 2 g by mouth daily.    . fluticasone (FLONASE) 50 MCG/ACT nasal spray Place 2 sprays into both nostrils daily. 48 g 3  . Lancets (FREESTYLE)  lancets Use as instructed 100 each 99  . losartan-hydrochlorothiazide (HYZAAR) 100-25 MG tablet Take 1 tablet by mouth daily. 90 tablet 1  . meclizine (ANTIVERT) 25 MG tablet Take 1 tablet (25 mg total) by mouth 3 (three) times daily as needed for dizziness. 30 tablet 0  . metFORMIN (GLUCOPHAGE-XR) 500 MG 24 hr tablet Take 2 tablets (1,000 mg total) by mouth daily with breakfast. 180 tablet 1  . naproxen (NAPROSYN) 500 MG tablet TAKE 1 TABLET BY MOUTH 2 TIMES DAILY WITH A MEAL. 60 tablet 3  . omeprazole (PRILOSEC) 40 MG capsule Take 1 capsule (40 mg total) by mouth daily. 90 capsule 3  . Potassium 99 MG TABS Take 1 tablet (99 mg total) by mouth daily. 90 tablet 1  . Spacer/Aero-Holding Chambers DEVI Misc spacer for inhaler - disp per insurance coverage and patient preference. 1 each 1   No current facility-administered medications for this visit.     Allergies  Allergen Reactions  . Food     soy  . Soybean-Containing Drug Products       Review of Systems:  Constitutional:  No  fever, no chills, No recent illness, No significant fatigue.   HEENT: No  headache  Cardiac: No  chest pain  Respiratory:  No  shortness of breath. No  Cough  Gastrointestinal: No  abdominal pain  Musculoskeletal: No new myalgia/arthralgia  Neurologic: No  weakness, No  dizziness  Exam:  BP 117/70 (BP Location: Left Arm, Patient Position: Sitting, Cuff  Size: Normal)   Pulse 73   Temp 97.9 F (36.6 C) (Oral)   Wt 190 lb 1.3 oz (86.2 kg)   BMI 31.63 kg/m   Constitutional: VS see above. General Appearance: alert, well-developed, well-nourished, NAD  Eyes: Normal lids and conjunctive, non-icteric sclera  Neck: No masses, trachea midline. No tenderness/mass appreciated. No lymphadenopathy  Respiratory: Normal respiratory effort. no wheeze, no rhonchi, no rales  Cardiovascular: S1/S2 normal, no murmur, no rub/gallop auscultated. RRR. No lower extremity edema.   Gastrointestinal: Nontender, no  masses.   Musculoskeletal: Gait normal.   Neurological: Normal balance/coordination. No tremor.   Skin: warm, dry, intact.   Psychiatric: Normal judgment/insight. Normal mood and affect.   Results for orders placed or performed in visit on 06/19/19 (from the past 72 hour(s))  POCT HgB A1C     Status: Abnormal   Collection Time: 06/19/19  1:38 PM  Result Value Ref Range   Hemoglobin A1C 6.8 (A) 4.0 - 5.6 %   HbA1c POC (<> result, manual entry)     HbA1c, POC (prediabetic range)     HbA1c, POC (controlled diabetic range)      No results found.     ASSESSMENT/PLAN: The encounter diagnosis was Type 2 diabetes mellitus without complication, without long-term current use of insulin ([place]).   Patient is moving to [place] next week. Refilled meds and advised patient to establish care within the next three months to follow up on DM/HTN.   Order annual mammogram, annual labs, and influenza vaccine. Patient UTD on TDAP, Pap Smear, Colonoscopy. Will also order Quantiferon Gold TB test for patient's move per her request.   Meds ordered this encounter  Medications  . amLODipine (NORVASC) 5 MG tablet    Sig: Take 1 tablet (5 mg total) by mouth daily.    Dispense:  90 tablet    Refill:  0  . fluticasone (FLONASE) 50 MCG/ACT nasal spray    Sig: Place 2 sprays into both nostrils daily.    Dispense:  48 g    Refill:  3  . losartan-hydrochlorothiazide (HYZAAR) 100-25 MG tablet    Sig: Take 1 tablet by mouth daily.    Dispense:  90 tablet    Refill:  0  . metFORMIN (GLUCOPHAGE-XR) 500 MG 24 hr tablet    Sig: Take 2 tablets (1,000 mg total) by mouth daily with breakfast.    Dispense:  180 tablet    Refill:  0  . omeprazole (PRILOSEC) 40 MG capsule    Sig: Take 1 capsule (40 mg total) by mouth daily.    Dispense:  90 capsule    Refill:  0    Orders Placed This Encounter  Procedures  . MM 3D SCREEN BREAST BILATERAL  . Flu Vaccine QUAD 6+ mos PF IM (Fluarix Quad PF)  .  QuantiFERON-TB Gold Plus  . CBC  . COMPLETE METABOLIC PANEL WITH GFR  . Lipid panel  . POCT HgB A1C    Patient Instructions   General Preventive Care  Most recent routine screening lipids/other labs: need blood drawn!  Tobacco: don't!   Alcohol: responsible moderation is ok for most adults - if you have concerns about your alcohol intake, please talk to me!   Exercise: as tolerated to reduce risk of cardiovascular disease and diabetes. Strength training will also prevent osteoporosis.   Mental health: if need for mental health care (medicines, counseling, other), or concerns about moods, please let me know!   Sexual health: if need for STD  testing, or if concerns with libido/pain problems, please let me know!   Advanced Directive: Living Will and/or Healthcare Power of Attorney recommended for all adults, regardless of age or health.  Vaccines - see below for records   Flu vaccine: recommended for almost everyone, every fall.   Shingles vaccine: Shingrix recommended after age 45.   Pneumonia vaccines: recommended booster after age 85  Tetanus booster: Tdap recommended every 10 years.  Cancer screenings   Colon cancer screening: due 05/2020.   Breast cancer screening: mammogram ordered today.   Cervical cancer screening: Pap due 04/2023.   Lung cancer screening: not needed for non-smokers  Infection screenings . HIV: done 05/2016 and negative! . Gonorrhea/Chlamydia: screening as needed . Hepatitis C: done 05/2016 and negative! . TB: certain at-risk populations, or depending on work requirements and/or travel history Other  Bone Density Test: recommended for women at age 13    Immunization History  Administered Date(s) Administered  . Influenza Split 05/16/2014  . Influenza, Seasonal, Injecte, Preservative Fre 05/06/2015  . Influenza,inj,Quad PF,6+ Mos 05/03/2018, 06/19/2019  . Influenza-Unspecified 05/06/2015, 05/28/2016  . Pneumococcal Polysaccharide-23  03/01/2012  . Tdap 12/14/2005, 06/12/2016         Visit summary with medication list and pertinent instructions was printed for patient to review. All questions at time of visit were answered - patient instructed to contact office with any additional concerns or updates. ER/RTC precautions were reviewed with the patient.   Follow-up plan: Return in about 3 months (around 09/19/2019) for please establish with new doctor by this time in [place] so they can monitor your sugars! .     Please note: voice recognition software was used to produce this document, and typos may escape review. Please contact Dr. Sheppard Coil for any needed clarifications.

## 2019-06-20 DIAGNOSIS — E1169 Type 2 diabetes mellitus with other specified complication: Secondary | ICD-10-CM | POA: Diagnosis not present

## 2019-06-20 DIAGNOSIS — Z0000 Encounter for general adult medical examination without abnormal findings: Secondary | ICD-10-CM | POA: Diagnosis not present

## 2019-06-20 DIAGNOSIS — J4520 Mild intermittent asthma, uncomplicated: Secondary | ICD-10-CM | POA: Diagnosis not present

## 2019-06-20 DIAGNOSIS — I10 Essential (primary) hypertension: Secondary | ICD-10-CM | POA: Diagnosis not present

## 2019-06-20 DIAGNOSIS — Z23 Encounter for immunization: Secondary | ICD-10-CM | POA: Diagnosis not present

## 2019-06-20 DIAGNOSIS — E119 Type 2 diabetes mellitus without complications: Secondary | ICD-10-CM | POA: Diagnosis not present

## 2019-06-20 DIAGNOSIS — E785 Hyperlipidemia, unspecified: Secondary | ICD-10-CM | POA: Diagnosis not present

## 2019-06-20 DIAGNOSIS — Z1231 Encounter for screening mammogram for malignant neoplasm of breast: Secondary | ICD-10-CM | POA: Diagnosis not present

## 2019-06-20 DIAGNOSIS — Z111 Encounter for screening for respiratory tuberculosis: Secondary | ICD-10-CM | POA: Diagnosis not present

## 2019-06-20 MED FILL — LOSARTAN-HCTZ 100-25 MG TAB: 100-25 | 30 days supply | Qty: 30 | Fill #0

## 2019-06-22 LAB — COMPLETE METABOLIC PANEL WITH GFR
AG Ratio: 1.4 (calc) (ref 1.0–2.5)
ALT: 21 U/L (ref 6–29)
AST: 24 U/L (ref 10–35)
Albumin: 4.4 g/dL (ref 3.6–5.1)
Alkaline phosphatase (APISO): 49 U/L (ref 37–153)
BUN: 19 mg/dL (ref 7–25)
CO2: 33 mmol/L — ABNORMAL HIGH (ref 20–32)
Calcium: 9.5 mg/dL (ref 8.6–10.4)
Chloride: 99 mmol/L (ref 98–110)
Creat: 0.83 mg/dL (ref 0.50–0.99)
GFR, Est African American: 88 mL/min/{1.73_m2} (ref >=60)
GFR, Est Non African American: 76 mL/min/{1.73_m2} (ref >=60)
Globulin: 3.2 g/dL (calc) (ref 1.9–3.7)
Glucose, Bld: 106 mg/dL — ABNORMAL HIGH (ref 65–99)
Potassium: 3.7 mmol/L (ref 3.5–5.3)
Sodium: 141 mmol/L (ref 135–146)
Total Bilirubin: 0.5 mg/dL (ref 0.2–1.2)
Total Protein: 7.6 g/dL (ref 6.1–8.1)

## 2019-06-22 LAB — QUANTIFERON-TB GOLD PLUS
Mitogen-NIL: >10 IU/mL
NIL: 0.04 IU/mL
QuantiFERON-TB Gold Plus: NEGATIVE
TB1-NIL: 0 IU/mL
TB2-NIL: 0 IU/mL

## 2019-06-22 LAB — CBC
HCT: 40.2 % (ref 35.0–45.0)
Hemoglobin: 13 g/dL (ref 11.7–15.5)
MCH: 24.9 pg — ABNORMAL LOW (ref 27.0–33.0)
MCHC: 32.3 g/dL (ref 32.0–36.0)
MCV: 76.9 fL — ABNORMAL LOW (ref 80.0–100.0)
MPV: 11.5 fL (ref 7.5–12.5)
Platelets: 166 10*3/uL (ref 140–400)
RBC: 5.23 10*6/uL — ABNORMAL HIGH (ref 3.80–5.10)
RDW: 14.6 % (ref 11.0–15.0)
WBC: 5.2 10*3/uL (ref 3.8–10.8)

## 2019-06-22 LAB — LIPID PANEL
Cholesterol: 234 mg/dL — ABNORMAL HIGH (ref <200)
HDL: 73 mg/dL (ref >=50)
LDL Cholesterol (Calc): 145 mg/dL (calc) — ABNORMAL HIGH
Non-HDL Cholesterol (Calc): 161 mg/dL (calc) — ABNORMAL HIGH (ref <130)
Total CHOL/HDL Ratio: 3.2 (calc) (ref <5.0)
Triglycerides: 56 mg/dL (ref <150)

## 2019-07-02 ENCOUNTER — Other Ambulatory Visit: Payer: Self-pay

## 2019-07-02 DIAGNOSIS — J3089 Other allergic rhinitis: Secondary | ICD-10-CM

## 2019-07-02 DIAGNOSIS — I10 Essential (primary) hypertension: Secondary | ICD-10-CM

## 2019-07-02 DIAGNOSIS — K219 Gastro-esophageal reflux disease without esophagitis: Secondary | ICD-10-CM

## 2019-07-02 MED ORDER — METFORMIN HCL ER 500 MG PO TB24: 1000.0000 mg | ORAL_TABLET | Freq: Every day | ORAL | 0 refills | Status: AC

## 2019-07-02 MED ORDER — LOSARTAN POTASSIUM-HCTZ 100-25 MG PO TABS: 1.0000 | ORAL_TABLET | Freq: Every day | ORAL | 0 refills | Status: DC

## 2019-07-02 MED ORDER — FLUTICASONE PROPIONATE 50 MCG/ACT NA SUSP: 2.0000 | Freq: Every day | NASAL | 3 refills | Status: AC

## 2019-07-02 MED ORDER — OMEPRAZOLE 40 MG PO CPDR: 40.0000 mg | DELAYED_RELEASE_CAPSULE | Freq: Every day | ORAL | 0 refills | Status: AC

## 2019-07-02 MED ORDER — AMLODIPINE BESYLATE 5 MG PO TABS: 5.0000 mg | ORAL_TABLET | Freq: Every day | ORAL | 0 refills | Status: DC

## 2019-07-08 ENCOUNTER — Telehealth: Payer: Self-pay

## 2019-07-08 ENCOUNTER — Encounter: Payer: Self-pay | Admitting: Osteopathic Medicine

## 2019-07-08 NOTE — Progress Notes (Signed)
03/13/2016 - 06/19/2019 

## 2019-07-08 NOTE — Telephone Encounter (Addendum)
Pt called requesting a letter stating she was under care of provider while residing in [place]. Pt has lost her social security, passport and ID. Pt was informed by the Social Security office that she will need documentation from her previous provider. Pt is requesting the letter and a copy of her immunization to be mailed to: [address], [place] [address]. Copy of signed immunization record and addressed envelope placed in MA's folder. Pt aware clinic will call with an update.

## 2019-07-08 NOTE — Telephone Encounter (Signed)
Printed and in [facility]icia's inbasket to mail

## 2019-07-08 NOTE — Telephone Encounter (Signed)
Pt has been updated. Aware that letter and copy of signed immunization record will be mailed out this afternoon. No other inquiries during call.

## 2019-07-21 ENCOUNTER — Telehealth: Payer: Self-pay

## 2019-07-21 NOTE — Telephone Encounter (Signed)
As per pt, has not received doctor's note or copy of immunization record by mail. Pt requested for letter/ imm record to be emailed. Informed pt that clinic is unable to due to security risks. Pt will try to locate a local business in [place] so that we may be able to fax paperwork. Pt will return a call on 07/22/19 with fax information. Paperwork placed in [employer]. No other inquiries during call.

## 2019-09-22 ENCOUNTER — Other Ambulatory Visit: Payer: Self-pay | Admitting: Osteopathic Medicine

## 2019-09-22 DIAGNOSIS — I10 Essential (primary) hypertension: Secondary | ICD-10-CM

## 2019-09-22 MED ORDER — LOSARTAN POTASSIUM-HCTZ 100-25 MG PO TABS: 1.0000 | ORAL_TABLET | Freq: Every day | ORAL | 2 refills | Status: DC

## 2019-12-25 ENCOUNTER — Other Ambulatory Visit: Payer: Self-pay

## 2019-12-25 MED ORDER — AMLODIPINE BESYLATE 5 MG PO TABS: 5.0000 mg | ORAL_TABLET | Freq: Every day | ORAL | 0 refills | Status: DC

## 2019-12-26 ENCOUNTER — Other Ambulatory Visit: Payer: Self-pay | Admitting: Osteopathic Medicine

## 2019-12-30 ENCOUNTER — Other Ambulatory Visit: Payer: Self-pay | Admitting: Osteopathic Medicine

## 2020-01-21 ENCOUNTER — Other Ambulatory Visit: Payer: Self-pay | Admitting: Osteopathic Medicine

## 2020-02-09 ENCOUNTER — Other Ambulatory Visit: Payer: Self-pay | Admitting: Osteopathic Medicine

## 2020-03-22 ENCOUNTER — Telehealth: Payer: Self-pay

## 2020-03-22 ENCOUNTER — Other Ambulatory Visit: Payer: Self-pay | Admitting: Osteopathic Medicine

## 2020-03-22 DIAGNOSIS — I10 Essential (primary) hypertension: Secondary | ICD-10-CM

## 2020-03-22 MED ORDER — AMLODIPINE BESYLATE 5 MG PO TABS: 5.0000 mg | ORAL_TABLET | Freq: Every day | ORAL | 0 refills | Status: AC

## 2020-03-22 MED ORDER — LOSARTAN POTASSIUM-HCTZ 100-25 MG PO TABS: 1.0000 | ORAL_TABLET | Freq: Every day | ORAL | 2 refills | Status: AC

## 2020-03-22 NOTE — Telephone Encounter (Signed)
Patient called into office requesting 30 day supply of blood pressure medication. Patient [place] and will not be able to get medications until after she establishes care in [place]. Per Dr. Sheppard Coil we will prescribe a 30 day supply. No further refills.

## 2020-04-18 ENCOUNTER — Other Ambulatory Visit: Payer: Self-pay | Admitting: Osteopathic Medicine

## 2020-04-18 DIAGNOSIS — I10 Essential (primary) hypertension: Secondary | ICD-10-CM

## 2020-08-08 IMAGING — MG DIGITAL SCREENING BILATERAL MAMMOGRAM WITH TOMO AND CAD
8 series · 8 of 24 positions shown · non-contrast
Comparison: Previous exam(s).

CLINICAL DATA: Screening.

EXAM:
DIGITAL SCREENING BILATERAL MAMMOGRAM WITH TOMO AND CAD

[R CC synth-2D]
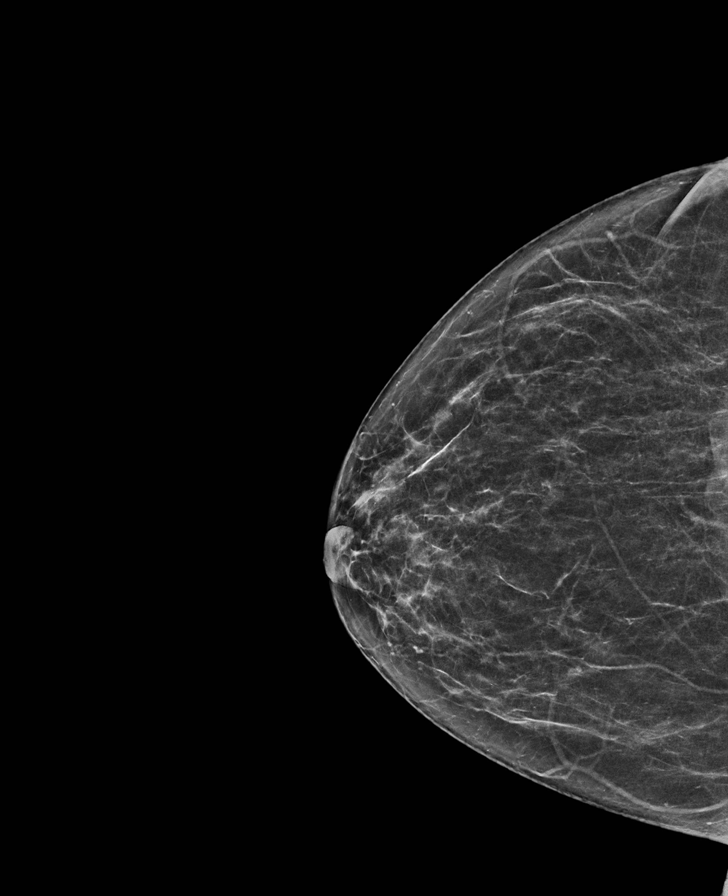

[L MLO synth-2D]
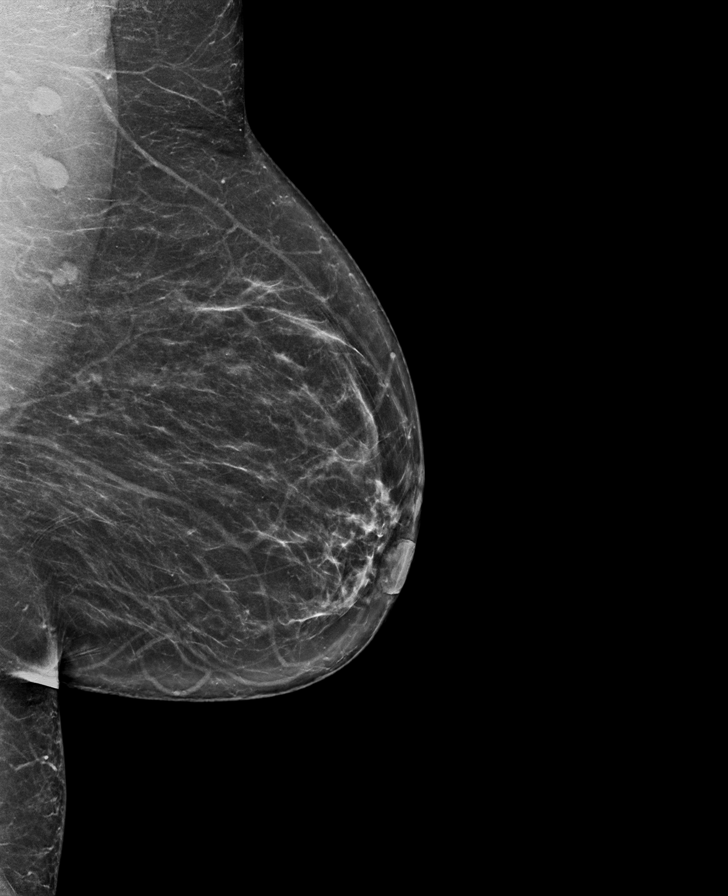

[R MLO synth-2D]
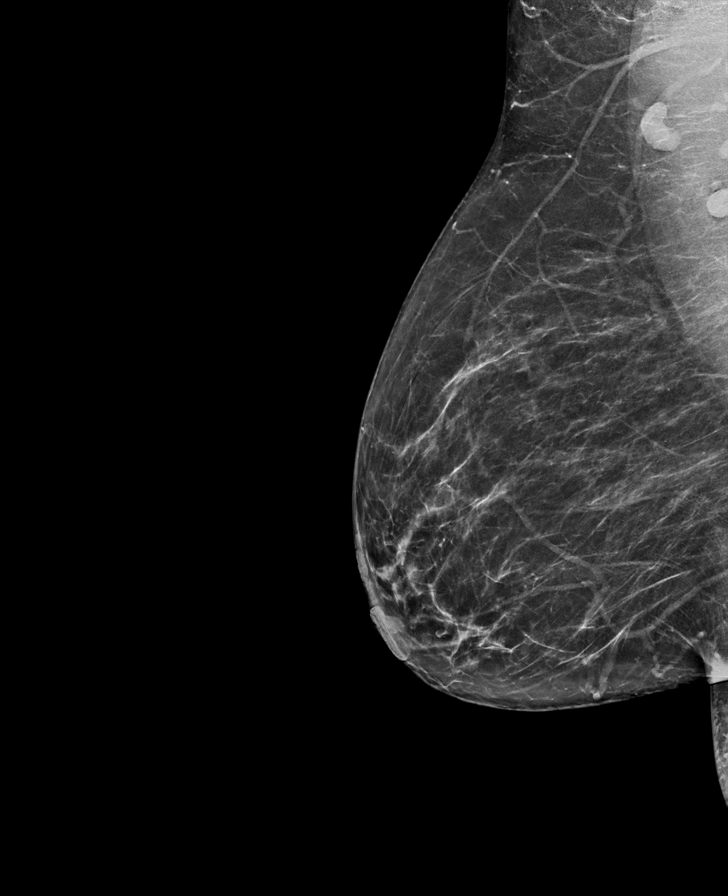

[L CC synth-2D]
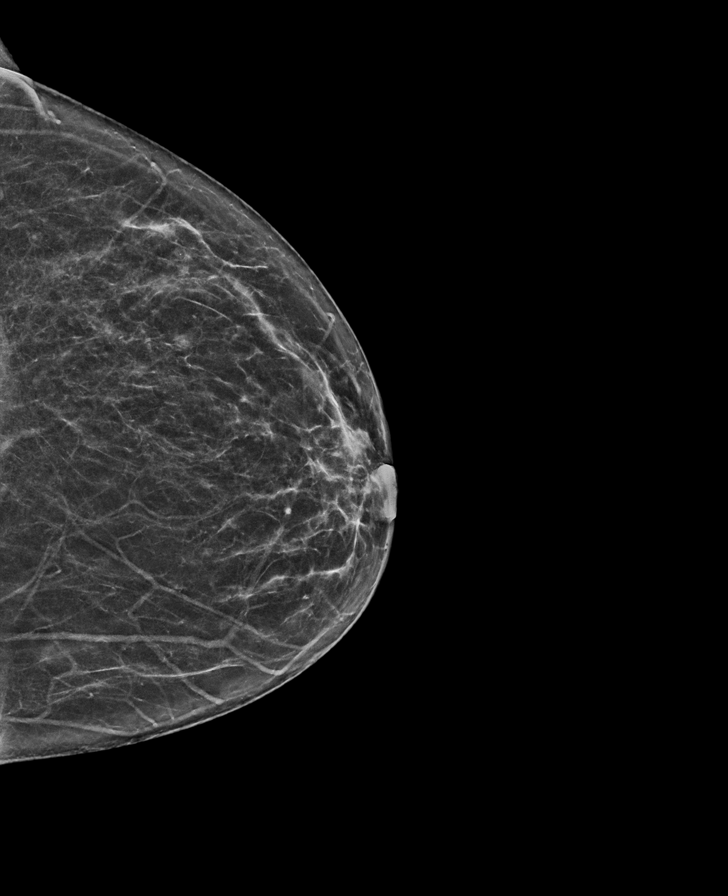

[L MLO tomo · tomo slice 37/72.0]
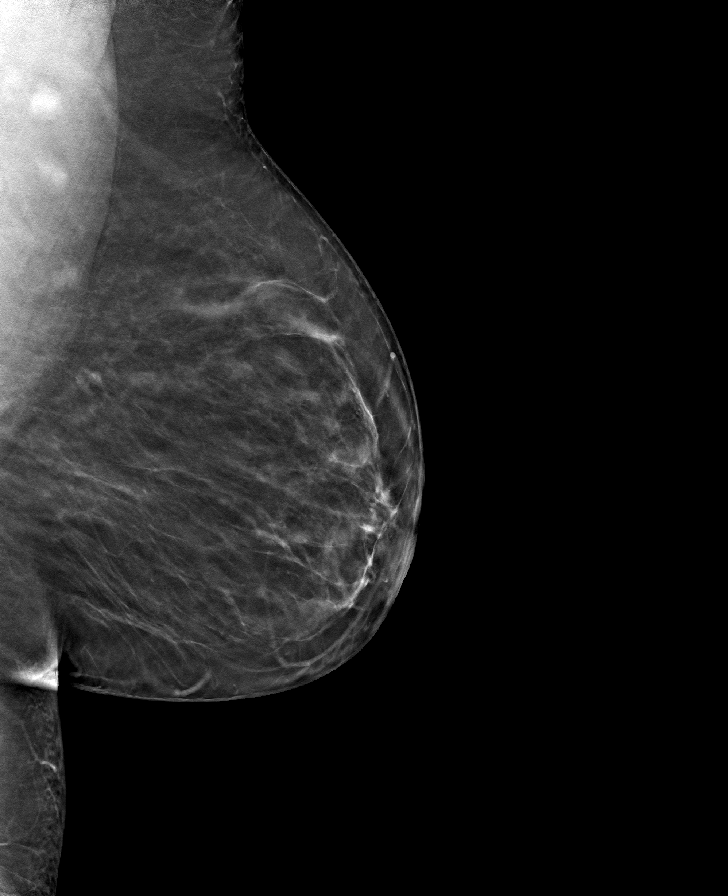

[R CC tomo · tomo slice 30/59.0]
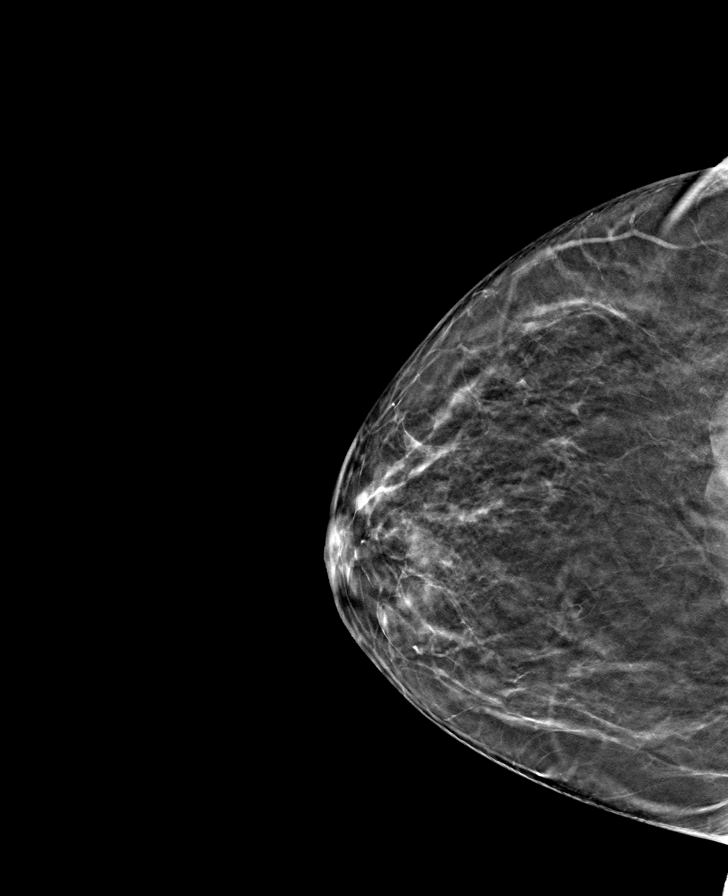

[R MLO tomo · tomo slice 35/69.0]
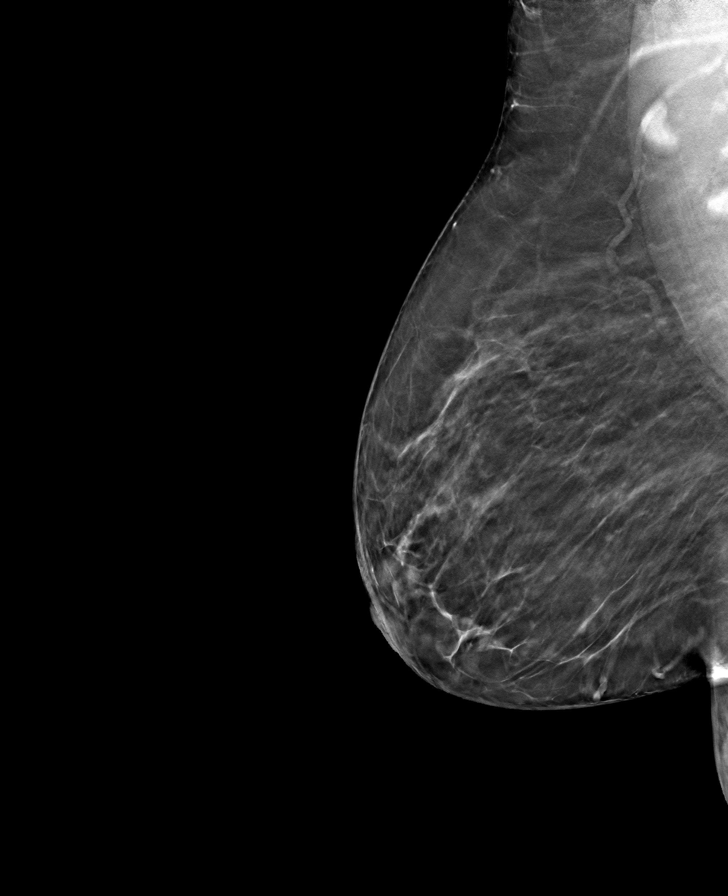

[L CC tomo · tomo slice 31/60.0]
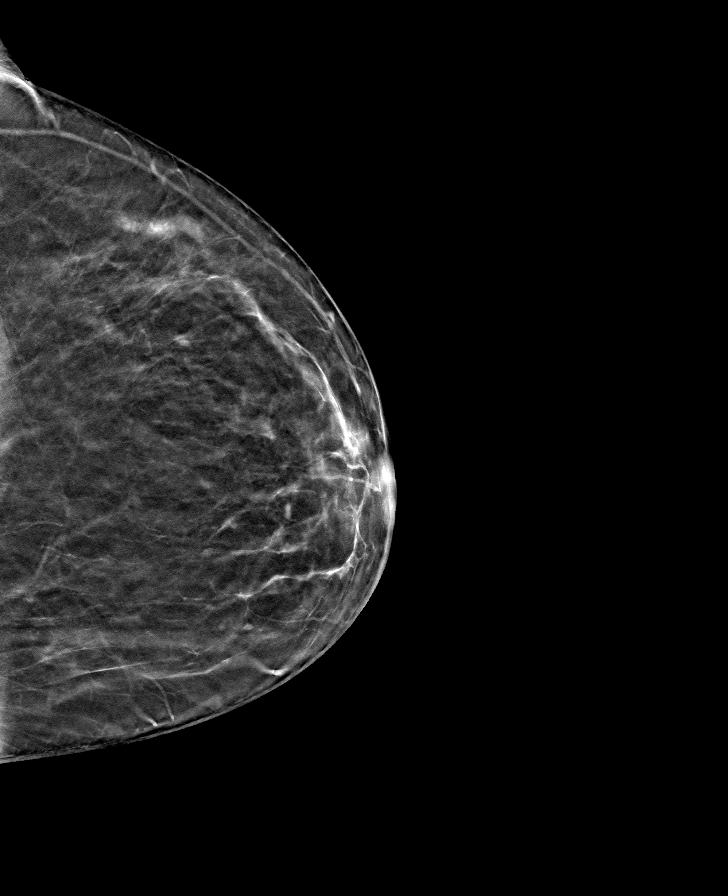

[8 of 24 positions shown; findings below may reference images not displayed]

ACR Breast Density Category b: There are scattered areas of
fibroglandular density.
FINDINGS: There are no findings suspicious for malignancy. Images were
processed with CAD.
IMPRESSION: No mammographic evidence of malignancy. A result letter of this
screening mammogram will be mailed directly to the patient.

RECOMMENDATION:
Screening mammogram in one year. (Code:[id number])

BI-RADS CATEGORY  1: Negative.
